# Patient Record
Sex: Male | Born: 1970 | Race: White | Hispanic: No | Marital: Single | Smoking: Never smoker
Health system: Southern US, Community
[De-identification: ages and names within clinical notes are randomized; demographics above are authoritative.]

## PROBLEM LIST (undated history)

## (undated) DIAGNOSIS — E119 Type 2 diabetes mellitus without complications: Secondary | ICD-10-CM

## (undated) DIAGNOSIS — I10 Essential (primary) hypertension: Secondary | ICD-10-CM

## (undated) DIAGNOSIS — Z21 Asymptomatic human immunodeficiency virus [HIV] infection status: Secondary | ICD-10-CM

## (undated) DIAGNOSIS — B20 Human immunodeficiency virus [HIV] disease: Secondary | ICD-10-CM

## (undated) HISTORY — PX: TONSILLECTOMY: SUR1361

---

## 2017-02-16 ENCOUNTER — Encounter (HOSPITAL_COMMUNITY): Payer: Self-pay | Admitting: *Deleted

## 2017-02-16 ENCOUNTER — Emergency Department (HOSPITAL_COMMUNITY)
Admission: EM | Admit: 2017-02-16 | Discharge: 2017-02-17 | Disposition: A | Discharge status: Home / Self Care | Payer: BC Managed Care – PPO | Attending: Emergency Medicine | Admitting: Emergency Medicine

## 2017-02-16 DIAGNOSIS — Z7984 Long term (current) use of oral hypoglycemic drugs: Secondary | ICD-10-CM | POA: Insufficient documentation

## 2017-02-16 DIAGNOSIS — I10 Essential (primary) hypertension: Secondary | ICD-10-CM | POA: Insufficient documentation

## 2017-02-16 DIAGNOSIS — F411 Generalized anxiety disorder: Secondary | ICD-10-CM | POA: Diagnosis not present

## 2017-02-16 DIAGNOSIS — F329 Major depressive disorder, single episode, unspecified: Secondary | ICD-10-CM | POA: Diagnosis present

## 2017-02-16 DIAGNOSIS — Z046 Encounter for general psychiatric examination, requested by authority: Secondary | ICD-10-CM | POA: Diagnosis not present

## 2017-02-16 DIAGNOSIS — F410 Panic disorder [episodic paroxysmal anxiety] without agoraphobia: Secondary | ICD-10-CM | POA: Diagnosis not present

## 2017-02-16 DIAGNOSIS — H6091 Unspecified otitis externa, right ear: Secondary | ICD-10-CM | POA: Insufficient documentation

## 2017-02-16 DIAGNOSIS — E119 Type 2 diabetes mellitus without complications: Secondary | ICD-10-CM | POA: Insufficient documentation

## 2017-02-16 DIAGNOSIS — B20 Human immunodeficiency virus [HIV] disease: Secondary | ICD-10-CM | POA: Diagnosis not present

## 2017-02-16 DIAGNOSIS — R0981 Nasal congestion: Secondary | ICD-10-CM | POA: Insufficient documentation

## 2017-02-16 DIAGNOSIS — Z79899 Other long term (current) drug therapy: Secondary | ICD-10-CM | POA: Diagnosis not present

## 2017-02-16 DIAGNOSIS — R45851 Suicidal ideations: Secondary | ICD-10-CM | POA: Diagnosis not present

## 2017-02-16 DIAGNOSIS — F322 Major depressive disorder, single episode, severe without psychotic features: Secondary | ICD-10-CM | POA: Diagnosis not present

## 2017-02-16 HISTORY — DX: Human immunodeficiency virus (HIV) disease: B20

## 2017-02-16 HISTORY — DX: Asymptomatic human immunodeficiency virus (hiv) infection status: Z21

## 2017-02-16 HISTORY — DX: Type 2 diabetes mellitus without complications: E11.9

## 2017-02-16 HISTORY — DX: Essential (primary) hypertension: I10

## 2017-02-16 LAB — COMPREHENSIVE METABOLIC PANEL
ALBUMIN: 4.2 g/dL (ref 3.5–5.0)
ALT: 49 U/L (ref 17–63)
ANION GAP: 12 (ref 5–15)
AST: 28 U/L (ref 15–41)
Alkaline Phosphatase: 53 U/L (ref 38–126)
BUN: 17 mg/dL (ref 6–20)
CHLORIDE: 100 mmol/L — AB (ref 101–111)
CO2: 26 mmol/L (ref 22–32)
CREATININE: 1.1 mg/dL (ref 0.61–1.24)
Calcium: 9 mg/dL (ref 8.9–10.3)
GFR calc non Af Amer: >60 mL/min (ref >60)
GLUCOSE: 123 mg/dL — AB (ref 65–99)
Potassium: 3.9 mmol/L (ref 3.5–5.1)
SODIUM: 138 mmol/L (ref 135–145)
Total Bilirubin: 0.8 mg/dL (ref 0.3–1.2)
Total Protein: 8.2 g/dL — ABNORMAL HIGH (ref 6.5–8.1)

## 2017-02-16 LAB — CBC
HEMATOCRIT: 45.9 % (ref 39.0–52.0)
HEMOGLOBIN: 15.6 g/dL (ref 13.0–17.0)
MCH: 31 pg (ref 26.0–34.0)
MCHC: 34 g/dL (ref 30.0–36.0)
MCV: 91.1 fL (ref 78.0–100.0)
Platelets: 237 10*3/uL (ref 150–400)
RBC: 5.04 MIL/uL (ref 4.22–5.81)
RDW: 13.3 % (ref 11.5–15.5)
WBC: 7.3 10*3/uL (ref 4.0–10.5)

## 2017-02-16 LAB — RAPID URINE DRUG SCREEN, HOSP PERFORMED
AMPHETAMINES: NOT DETECTED
Barbiturates: NOT DETECTED
Benzodiazepines: NOT DETECTED
COCAINE: NOT DETECTED
OPIATES: NOT DETECTED
TETRAHYDROCANNABINOL: NOT DETECTED

## 2017-02-16 LAB — SALICYLATE LEVEL

## 2017-02-16 LAB — ETHANOL: Alcohol, Ethyl (B): <10 mg/dL (ref <10)

## 2017-02-16 LAB — ACETAMINOPHEN LEVEL: Acetaminophen (Tylenol), Serum: <10 ug/mL — ABNORMAL LOW (ref 10–30)

## 2017-02-16 MED ORDER — LORAZEPAM 1 MG PO TABS
1.0000 mg | ORAL_TABLET | Freq: Once | ORAL | Status: AC
  Administered 2017-02-17: 1 mg via ORAL
  Filled 2017-02-16: qty 1

## 2017-02-16 NOTE — ED Notes (Signed)
Pt in scrubs, belongings in locker 28

## 2017-02-16 NOTE — ED Notes (Signed)
Patient reports SI no plan. Patient denies HI/AVH. Plan of care discussed. Encouragement and support provided and safety maintain. Q 15 min safety check in place and video monitoring.

## 2017-02-16 NOTE — ED Notes (Signed)
ED Provider at bedside. 

## 2017-02-16 NOTE — ED Notes (Signed)
Bed: WBH42 Expected date:  Expected time:  Means of arrival:  Comments: Hold for 28 

## 2017-02-16 NOTE — ED Notes (Signed)
ED Provider at bedside.  Timothy Knox

## 2017-02-16 NOTE — ED Triage Notes (Signed)
Pt got upset wed. And has had high anxiety and panic attacks.  Pt c/o si thoughts, no plan.  Pt went to urgent care for cold symptoms and also talked about SI, so they called [facility] to bring him here.  He has never had thoughts like this before.  Denies HI.  Denies AVH.

## 2017-02-16 NOTE — ED Provider Notes (Signed)
[facility]-EMERGENCY DEPT Provider Note   CSN: [id number] Arrival date & time: 02/16/17  1829     History   Chief Complaint Chief Complaint  Patient presents with  . Medical Clearance    HPI Pervis HockingScott L Echeverri is a 46 y.o. male with a h/o of DM Type II, HIV, and HTN, who presents to the emergency department with a chief complaint of worsening suicidal ideation x6 days.  The patient reports that he was in a relationship with his partner of 25 years, and they just ended their relationship 6 days ago.  He reports associated constant, worsening anxiety, panic attacks, low mood, decreased appetite, and increased sleep. He reports that he has been unable to work since the break up because of his symptoms. No h/o of similar symptoms. He denies HI, and auditory or visual hallucinations. He denies a specific plan, but he states that his symptoms have been worsening, and he feels as if he spiraling out of control and is unable to manage his symptoms on his own.   He also complains of a "tickle in his throat and congestion for the last 2-3 days. He denies dyspnea, chest pain, fever, or chills.   The history is provided by the patient. No language interpreter was used.    Past Medical History:  Diagnosis Date  . Diabetes mellitus without complication (HCC)   . HIV (human immunodeficiency virus infection) (HCC)   . Hypertension     There are no active problems to display for this patient.   History reviewed. No pertinent surgical history.     Home Medications    Prior to Admission medications   Medication Sig Start Date End Date Taking? Authorizing Provider  atorvastatin (LIPITOR) 20 MG tablet Take 1 tablet daily 02/08/17  Yes [provider]  elvitegravir-cobicistat-emtricitabine-tenofovir (GENVOYA) 150-150-200-10 MG TABS tablet Take 1 tablet by mouth daily with breakfast.   Yes [provider]  lisinopril (PRINIVIL,ZESTRIL) 10 MG tablet Take 1  tablet daily 02/05/17  Yes [provider]  metFORMIN (GLUCOPHAGE) 1000 MG tablet Take 1 tablet twice daily 02/05/17  Yes [provider]    Family History History reviewed. No pertinent family history.  Social History Social History  Substance Use Topics  . Smoking status: Never Smoker  . Smokeless tobacco: Never Used  . Alcohol use Yes     Comment: rarely     Allergies   Patient has no known allergies.   Review of Systems Review of Systems  Constitutional: Negative for activity change.  HENT: Positive for congestion and ear pain. Negative for sinus pain and sinus pressure.   Respiratory: Negative for cough and shortness of breath.   Cardiovascular: Negative for chest pain.  Gastrointestinal: Negative for abdominal pain, diarrhea, nausea and vomiting.  Musculoskeletal: Negative for back pain.  Skin: Negative for rash.     Physical Exam Updated Vital Signs BP 119/75 (BP Location: Left Arm)   Pulse 83   Temp 98.2 F (36.8 C) (Oral)   Resp 18   Ht 6' (1.829 m)   Wt 122.5 kg (270 lb)   SpO2 97%   BMI 36.62 kg/m   Physical Exam  Constitutional: He appears well-developed. No distress.  HENT:  Head: Normocephalic.  Right Ear: Tympanic membrane normal.  Left Ear: Tympanic membrane and ear canal normal.  Nose: Nose normal. Right sinus exhibits no maxillary sinus tenderness and no frontal sinus tenderness. Left sinus exhibits no maxillary sinus tenderness and no frontal sinus tenderness.  Mouth/Throat: Uvula is midline. Posterior oropharyngeal erythema present. No oropharyngeal exudate, posterior oropharyngeal edema or tonsillar abscesses.  Fungal infection and noted to the right canal.   Eyes: Conjunctivae are normal.  Neck: Neck supple.  Cardiovascular: Normal rate, regular rhythm and normal heart sounds.  Exam reveals no gallop and no friction rub.   No murmur heard. Pulmonary/Chest: Effort normal. No respiratory distress. He has no wheezes. He  has no rales.  Abdominal: Soft. He exhibits no distension. There is no tenderness. There is no rebound and no guarding.  Neurological: He is alert.  Skin: Skin is warm and dry. He is not diaphoretic.  Psychiatric: His behavior is normal.  Nursing note and vitals reviewed.   ED Treatments / Results  Labs (all labs ordered are listed, but only abnormal results are displayed) Labs Reviewed  COMPREHENSIVE METABOLIC PANEL - Abnormal; Notable for the following:       Result Value   Chloride 100 (*)    Glucose, Bld 123 (*)    Total Protein 8.2 (*)    All other components within normal limits  ACETAMINOPHEN LEVEL - Abnormal; Notable for the following:    Acetaminophen (Tylenol), Serum <10 (*)    All other components within normal limits  ETHANOL  SALICYLATE LEVEL  CBC  RAPID URINE DRUG SCREEN, HOSP PERFORMED    EKG  EKG Interpretation None       Radiology No results found.  Procedures Procedures (including critical care time)  Medications Ordered in ED Medications  metFORMIN (GLUCOPHAGE) tablet 1,000 mg (1,000 mg Oral Not Given 02/17/17 0133)  lisinopril (PRINIVIL,ZESTRIL) tablet 10 mg (not administered)  elvitegravir-cobicistat-emtricitabine-tenofovir (GENVOYA) 150-150-200-10 MG tablet 1 tablet (not administered)  atorvastatin (LIPITOR) tablet 20 mg (not administered)  LORazepam (ATIVAN) tablet 1 mg (1 mg Oral Given 02/17/17 0002)     Initial Impression / Assessment and Plan / ED Course  I have reviewed the triage vital signs and the nursing notes.  Pertinent labs & imaging results that were available during my care of the patient were reviewed by me and considered in my medical decision making (see chart for details).     46 year old male presenting with passive SI, worsening low mood, anxiety, and increasing panic attacks x7 days that began after he went through a break up with his partner of 25 years. No HI or auditory and visual hallucinations. On exam, mild  fungal infection noted to the right ear canal. Spoke with pharmacy and no otic fungal medications are available in the hospital pharmacy at this time. Will d/c the patient home with a prescription. The patient is medically cleared at this time. Consult to TTS pending. Home meds ordered. Patient care transferred to [facility] at the end of my shift. Patient presentation, ED course, and plan of care discussed with review of all pertinent labs and imaging. Please see his/her note for further details regarding further ED course and disposition.  Final Clinical Impressions(s) / ED Diagnoses   Final diagnoses:  None    New Prescriptions New Prescriptions   No medications on file     Barkley Boards, PA-C 02/17/17 0133    Pricilla Loveless, MD 02/17/17 2314

## 2017-02-17 DIAGNOSIS — Z630 Problems in relationship with spouse or partner: Secondary | ICD-10-CM | POA: Diagnosis not present

## 2017-02-17 DIAGNOSIS — F410 Panic disorder [episodic paroxysmal anxiety] without agoraphobia: Secondary | ICD-10-CM

## 2017-02-17 DIAGNOSIS — F322 Major depressive disorder, single episode, severe without psychotic features: Secondary | ICD-10-CM | POA: Diagnosis not present

## 2017-02-17 DIAGNOSIS — F419 Anxiety disorder, unspecified: Secondary | ICD-10-CM | POA: Diagnosis not present

## 2017-02-17 DIAGNOSIS — R451 Restlessness and agitation: Secondary | ICD-10-CM

## 2017-02-17 DIAGNOSIS — R4587 Impulsiveness: Secondary | ICD-10-CM

## 2017-02-17 DIAGNOSIS — F411 Generalized anxiety disorder: Secondary | ICD-10-CM | POA: Diagnosis not present

## 2017-02-17 DIAGNOSIS — R45851 Suicidal ideations: Secondary | ICD-10-CM

## 2017-02-17 MED ORDER — LISINOPRIL 10 MG PO TABS
10.0000 mg | ORAL_TABLET | Freq: Every day | ORAL | Status: DC
  Administered 2017-02-17: 10 mg via ORAL
  Filled 2017-02-17: qty 1

## 2017-02-17 MED ORDER — METFORMIN HCL 500 MG PO TABS
1000.0000 mg | ORAL_TABLET | Freq: Two times a day (BID) | ORAL | Status: DC
  Administered 2017-02-17: 1000 mg via ORAL
  Filled 2017-02-17: qty 2

## 2017-02-17 MED ORDER — ATORVASTATIN CALCIUM 20 MG PO TABS
20.0000 mg | ORAL_TABLET | Freq: Every day | ORAL | Status: DC
  Filled 2017-02-17: qty 1

## 2017-02-17 MED ORDER — ELVITEG-COBIC-EMTRICIT-TENOFAF 150-150-200-10 MG PO TABS
1.0000 | ORAL_TABLET | Freq: Every day | ORAL | Status: DC
  Administered 2017-02-17: 1 via ORAL
  Filled 2017-02-17: qty 1

## 2017-02-17 NOTE — BH Assessment (Addendum)
Assessment Note  Timothy Knox is an 46 y.o. male, who presents voluntary and unaccompanied to [facility]. Clinician asked the pt, "what brought you to the hospital?" Pt reported, six days ago his partner left him after being together for 25 years. Pt reported, his partner told him he left because, "he built up some much resentment and couldn't stay" Pt reported, since the break up he has experienced panic attacks (can breathe, chest tightening, spiraling out), "hyper-anxiety." Pt reported, earlier he had passive and active suicidal thoughts, with a plan of jumping off a building or cutting himself. Pt reported, access to kitchen knives. Pt reported, "I don't want to kill myself but I feel worked up and that comes to my mind." Pt reported, when he becomes anxious is when he has suicidal thoughts. Pt denies, SI, HI, AVH and self-injurious behaviors.   Pt denies abuse and substance use. Pt UDS is negative. Pt reported, he has a pending counseling appointment on 02/18/2017, with Marcello Moores. Pt reported, not knowing his counselors last name. Pt denies, previous inpatient admissions.   Pt presents quiet/wake in scrubs with logical/coherent speech. Pt's eye contact was good. Pt's mood was depressed/anxious. Pt's affect was congruent with mood. Pt's thought process was relevant/coherent. Pt's judgement was unimpaired. Pt's concentration was normal. Pt's insight and impulse control are good. Pt reported, if discharged from [facility] he could contract for safety. Pt reported, if inpatient treatment was recommended he would-sign-in voluntarily.   Diagnosis: F32.2 Major Depressive Disorder, Single episode Severe, without Psychosis.                    F41.1 Generalized Anxiety Disorder.  Past Medical History:  Past Medical History:  Diagnosis Date  . Diabetes mellitus without complication (HCC)   . HIV (human immunodeficiency virus infection) (HCC)   . Hypertension     History reviewed. No pertinent surgical  history.  Family History: History reviewed. No pertinent family history.  Social History:  reports that he has never smoked. He has never used smokeless tobacco. He reports that he drinks alcohol. He reports that he does not use drugs.  Additional Social History:  Alcohol / Drug Use Pain Medications: See MAR Prescriptions: See MAR Over the Counter: See MAR History of alcohol / drug use?: No history of alcohol / drug abuse (Pt denies. Pt's UDS is negative. )  CIWA: CIWA-Ar BP: 119/75 Pulse Rate: 83 COWS:    Allergies: No Known Allergies  Home Medications:  (Not in a hospital admission)  OB/GYN Status:  No LMP for male patient.  General Assessment Data Location of Assessment: [facility] TTS Assessment: In system Is this a Tele or Face-to-Face Assessment?: Face-to-Face Is this an Initial Assessment or a Re-assessment for this encounter?: Initial Assessment Marital status: Single Living Arrangements: Alone Can pt return to current living arrangement?: Yes Admission Status: Voluntary Is patient capable of signing voluntary admission?: Yes Referral Source: MD Insurance type: Self-pay     Crisis Care Plan Living Arrangements: Alone Legal Guardian: Other: (Self) Name of Psychiatrist: NA Name of Therapist: Marcello Moores. Pt unsure of last name.   Education Status Is patient currently in school?: No Current Grade: NA Highest grade of school patient has completed: Pt reported, almost has a Best boy.  Name of school: NA Contact person: NA  Risk to self with the past 6 months Suicidal Ideation: No-Not Currently/Within Last 6 Months Has patient been a risk to self within the past 6 months prior to admission? : Yes Suicidal Intent:  No-Not Currently/Within Last 6 Months Has patient had any suicidal intent within the past 6 months prior to admission? : Yes Is patient at risk for suicide?: Yes Suicidal Plan?: No-Not Currently/Within Last 6 Months Has patient had any suicidal plan within  the past 6 months prior to admission? : Yes Access to Means: Yes Specify Access to Suicidal Means: Pt reported, to cut self, jump off a building. . What has been your use of drugs/alcohol within the last 12 months?: Pt denies. Pt's UDS is negative.  Previous Attempts/Gestures: No How many times?: 0 Other Self Harm Risks: Pt denies.  Triggers for Past Attempts: None known Intentional Self Injurious Behavior: None (Pt denies. ) Family Suicide History: No Recent stressful life event(s): Other (Comment) (recent break up with partner of 25 years. ) Persecutory voices/beliefs?: No Depression: Yes Depression Symptoms: Feeling angry/irritable, Fatigue, Tearfulness, Isolating, Feeling worthless/self pity, Loss of interest in usual pleasures, Guilt Substance abuse history and/or treatment for substance abuse?: No Suicide prevention information given to non-admitted patients: Not applicable  Risk to Others within the past 6 months Homicidal Ideation: No (Pt denies. ) Does patient have any lifetime risk of violence toward others beyond the six months prior to admission? : No Thoughts of Harm to Others: No Current Homicidal Intent: No Current Homicidal Plan: No Access to Homicidal Means: No Identified Victim: NA History of harm to others?: No Assessment of Violence: None Noted Violent Behavior Description: NA Does patient have access to weapons?: No (kitchen knives. ) Criminal Charges Pending?: No Does patient have a court date: No Is patient on probation?: No  Psychosis Hallucinations: None noted (Pt denies.) Delusions: None noted (Pt denies.)  Mental Status Report Appearance/Hygiene: In scrubs Eye Contact: Good Motor Activity: Unremarkable Speech: Logical/coherent Level of Consciousness: Quiet/awake Mood: Depressed, Anxious Affect: Other (Comment) (congruent with mood. ) Anxiety Level: Panic Attacks Panic attack frequency: Pt reported, having panic since his break up six days ago.   Most recent panic attack: Pt reporte, tonight. Thought Processes: Relevant, Coherent Judgement: Unimpaired Orientation: Other (Comment) (day, year, city and state.) Obsessive Compulsive Thoughts/Behaviors: None  Cognitive Functioning Concentration: Normal Memory: Recent Intact IQ: Average Insight: Good Impulse Control: Good Appetite: Poor (Pt reported, not eatting in two days. ) Sleep: Decreased Total Hours of Sleep:  (Pt reported, 6-7 hours. ) Vegetative Symptoms: None  ADLScreening [facility] Assessment Services) Patient's cognitive ability adequate to safely complete daily activities?: Yes Patient able to express need for assistance with ADLs?: Yes Independently performs ADLs?: Yes (appropriate for developmental age)  Prior Inpatient Therapy Prior Inpatient Therapy: No Prior Therapy Dates: NA Prior Therapy Facilty/Provider(s): NA Reason for Treatment: NA  Prior Outpatient Therapy Prior Outpatient Therapy: Yes Prior Therapy Dates: 02/18/2017 Prior Therapy Facilty/Provider(s): Marcello MooresIsaac. Pt reported not knowing last name.  Reason for Treatment: Counselor.  Does patient have an ACCT team?: No Does patient have Intensive In-House Services?  : No Does patient have [facility] services? : No Does patient have P4CC services?: No  ADL Screening (condition at time of admission) Patient's cognitive ability adequate to safely complete daily activities?: Yes Is the patient deaf or have difficulty hearing?: No Does the patient have difficulty seeing, even when wearing glasses/contacts?: Yes Does the patient have difficulty concentrating, remembering, or making decisions?: Yes Patient able to express need for assistance with ADLs?: Yes Does the patient have difficulty dressing or bathing?: No Independently performs ADLs?: Yes (appropriate for developmental age) Communication: Independent Dressing (OT): Independent Grooming: Independent Feeding: Independent Bathing: Independent Toileting:  Independent  In/Out Bed: Independent Does the patient have difficulty walking or climbing stairs?: No Weakness of Legs: None Weakness of Arms/Hands: None       Abuse/Neglect Assessment (Assessment to be complete while patient is alone) Physical Abuse: Denies (Pt denies.) Verbal Abuse: Denies (Pt denies.) Sexual Abuse: Denies (Pt denies. ) Exploitation of patient/patient's resources: Denies (Pt denies. ) Self-Neglect: Denies (Pt denies.)     Merchant navy officer (For Healthcare) Does Patient Have a Medical Advance Directive?: No Type of Advance Directive: Healthcare Power of Attorney, Living will Copy of Healthcare Power of Attorney in Chart?: No - copy requested Copy of Living Will in Chart?: No - copy requested    Additional Information 1:1 In Past 12 Months?: No CIRT Risk: No Elopement Risk: No Does patient have medical clearance?: Yes     Disposition: Donell Sievert, PA recommends inpatient treatment. Disposition discussed with Tresa Endo, PA and Leslie Andrea, RN. TTS to seek placement.    Disposition Initial Assessment Completed for this Encounter: Yes Disposition of Patient: Inpatient treatment program Type of inpatient treatment program: Adult  On Site Evaluation by:   Reviewed with Physician:  Tresa Endo, PA and Donell Sievert, PA.  Redmond Pulling 02/17/2017 2:25 AM   Redmond Pulling, MS, LPC, CRC Triage Specialist [phone]

## 2017-02-17 NOTE — Discharge Instructions (Signed)
For your behavioral health needs, you advised to continue treatment with your current outpatient provider.  You have also indicated an interest in the Mental Health Intensive Outpatient Program and the Partial Hospitalization Program at the [facility].  Please review the printed information about these program provided by hospital staff at your convenience.  If you decide to pursue one of the programs, contact them at your earliest opportunity to ask about enrolling:       [facility]      [address]      [place], [place] [address]      [phone]

## 2017-02-17 NOTE — ED Notes (Signed)
Pt noted he is ready to go home. Denies SI/HI/AVH.

## 2017-02-17 NOTE — ED Provider Notes (Signed)
4:43 AM Patient recommended for inpatient treatment. TTS to seek placement.  Disposition to be determined by oncoming ED provider.  Vitals:   02/16/17 1852 02/16/17 2100  BP: 132/80 119/75  Pulse: 89 83  Resp: 17 18  Temp: 98.1 F (36.7 C) 98.2 F (36.8 C)  TempSrc: Oral Oral  SpO2: 98% 97%  Weight: 122.5 kg (270 lb)   Height: 6' (1.829 m)       Antony MaduraHumes, Endrit Gittins, PA-C 02/17/17 [id number]    Shon BatonHorton, Courtney F, MD 02/17/17 [phone]

## 2017-02-17 NOTE — [facility] Suicide Risk Assessment (Signed)
Suicide Risk Assessment  Discharge Assessment   [facility]   Principal Problem: GAD (generalized anxiety disorder) Discharge Diagnoses:  Patient Active Problem List   Diagnosis Date Noted  . GAD (generalized anxiety disorder) [F41.1] 02/17/2017  . Current severe episode of major depressive disorder without psychotic features without prior episode (HCC) [F32.2]    Pt was seen and chart reviewed with treatment team and Dr Sharma CovertNorman. [facility] a [facility] of suicidal ideation after a relationship break up. Pt stated he and his partner had been together for 25 years and since last week his anxiety and panic attacks have increased causing him to have increased suicidal thoughts. Pt stated he does not have a plan. Today, Pt denies suicidal/homicidal ideation, auditory and visual hallucinations, and does not appear to be responding to internal stimuli. Pt was able to contract for safety upon discharge. Pt has an appointment with his therapist, Duwayne Hecksaiah today at 3:00 PM. Pt denies access to weapons. Pt has been given outpatient resources for IOP and PHP and will look into them on his own. Pt denies drug and alcohol use, BAL and UDS are negative.  Pt is stable and psychiatrically clear for discharge.   Total Time spent with patient: 45 minutes  Musculoskeletal: Strength & Muscle Tone: within normal limits Gait & Station: normal Patient leans: N/A  Psychiatric Specialty Exam:   Blood pressure 134/87, pulse 86, temperature 98.2 F (36.8 C), temperature source Oral, resp. rate 20, height 6' (1.829 m), weight 122.5 kg (270 lb), SpO2 97 %.Body mass index is 36.62 kg/m.  General Appearance: Casual  Eye Contact::  Good  Speech:  Clear and Coherent409  Volume:  Normal  Mood:  Anxious and Depressed  Affect:  Congruent and Depressed  Thought Process:  Coherent, Goal Directed and Linear  Orientation:  Full (Time, Place, and Person)  Thought Content:  Logical   Suicidal Thoughts:  No  Homicidal Thoughts:  No  Memory:  Immediate;   Good Recent;   Good Remote;   Fair  Judgement:  Fair  Insight:  Fair  Psychomotor Activity:  Normal  Concentration:  Good  Recall:  Good  Fund of Knowledge:Good  Language: Good  Akathisia:  No  Handed:  Right  AIMS (if indicated):     Assets:  Communication Skills Desire for Improvement Financial Resources/Insurance Housing Physical Health Resilience Social Support Vocational/Educational  Sleep:     Cognition: WNL  ADL's:  Intact   Mental Status Per Nursing Assessment::   On Admission:   anxious, depressed and tearful  Demographic Factors:  Male, Caucasian and Gay, lesbian, or bisexual orientation  Loss Factors: Loss of significant relationship  Historical Factors: Impulsivity  Risk Reduction Factors:   Sense of responsibility to family, Employed, Positive social support and Positive therapeutic relationship  Continued Clinical Symptoms:  Severe Anxiety and/or Agitation Panic Attacks Depression:   Hopelessness Impulsivity  Cognitive Features That Contribute To Risk:  Closed-mindedness    Suicide Risk:  Minimal: No identifiable suicidal ideation.  Patients presenting with no risk factors but with morbid ruminations; may be classified as minimal risk based on the severity of the depressive symptoms    Plan Of Care/Follow-up recommendations:  Activity:  as tolerated Diet:  Heart Healthy  Laveda AbbeLaurie Britton Story Conti, NP 02/17/2017, 12:37 PM

## 2017-02-17 NOTE — ED Notes (Signed)
Patient denies pain and is resting comfortably.  

## 2017-02-17 NOTE — BH Assessment (Signed)
[facility] Assessment Progress Note  Per Juanetta BeetsJacqueline Norman, DO, this pt does not require psychiatric hospitalization at this time.  [facility] with recommendation to continue treatment with his outpatient provider.  This has been included in pt's discharge instructions.  Pt has also expressed an interest in the Mental Health Intensive Outpatient Program and the Partial Hospitalization Program, both at the [facility] [place].  Pt has been provided with printed information about both programs to follow up at his own initiative.  Pt's nurse, Angelique BlonderDenise, has been notified.  Doylene Canninghomas Lizvet Chunn, MA Triage Specialist [phone]

## 2017-12-10 ENCOUNTER — Other Ambulatory Visit: Payer: Self-pay

## 2017-12-10 ENCOUNTER — Encounter (HOSPITAL_BASED_OUTPATIENT_CLINIC_OR_DEPARTMENT_OTHER): Payer: Self-pay | Admitting: *Deleted

## 2017-12-15 ENCOUNTER — Encounter (HOSPITAL_BASED_OUTPATIENT_CLINIC_OR_DEPARTMENT_OTHER)
Admission: RE | Admit: 2017-12-15 | Discharge: 2017-12-15 | Disposition: A | Discharge status: Home / Self Care | Payer: BC Managed Care – PPO | Source: Ambulatory Visit | Attending: Plastic Surgery | Admitting: Plastic Surgery

## 2017-12-15 DIAGNOSIS — R9431 Abnormal electrocardiogram [ECG] [EKG]: Secondary | ICD-10-CM | POA: Diagnosis not present

## 2017-12-15 DIAGNOSIS — I10 Essential (primary) hypertension: Secondary | ICD-10-CM | POA: Diagnosis not present

## 2017-12-15 DIAGNOSIS — Z01810 Encounter for preprocedural cardiovascular examination: Secondary | ICD-10-CM | POA: Diagnosis present

## 2017-12-15 DIAGNOSIS — E119 Type 2 diabetes mellitus without complications: Secondary | ICD-10-CM | POA: Insufficient documentation

## 2017-12-15 DIAGNOSIS — Z01812 Encounter for preprocedural laboratory examination: Secondary | ICD-10-CM | POA: Insufficient documentation

## 2017-12-15 LAB — BASIC METABOLIC PANEL
ANION GAP: 8 (ref 5–15)
BUN: 13 mg/dL (ref 6–20)
CHLORIDE: 102 mmol/L (ref 98–111)
CO2: 29 mmol/L (ref 22–32)
Calcium: 9.7 mg/dL (ref 8.9–10.3)
Creatinine, Ser: 0.9 mg/dL (ref 0.61–1.24)
GFR calc Af Amer: >60 mL/min (ref >60)
Glucose, Bld: 113 mg/dL — ABNORMAL HIGH (ref 70–99)
POTASSIUM: 4.4 mmol/L (ref 3.5–5.1)
SODIUM: 139 mmol/L (ref 135–145)

## 2017-12-15 NOTE — Progress Notes (Signed)
Dr. Desmond Lopeurk reviewed EKG and OK'd for surgery on 12/17/17.

## 2017-12-17 ENCOUNTER — Ambulatory Visit (HOSPITAL_BASED_OUTPATIENT_CLINIC_OR_DEPARTMENT_OTHER): Payer: BC Managed Care – PPO | Admitting: Anesthesiology

## 2017-12-17 ENCOUNTER — Ambulatory Visit (HOSPITAL_BASED_OUTPATIENT_CLINIC_OR_DEPARTMENT_OTHER)
Admission: RE | Admit: 2017-12-17 | Discharge: 2017-12-17 | Disposition: A | Discharge status: Home / Self Care | Payer: BC Managed Care – PPO | Source: Ambulatory Visit | Attending: Plastic Surgery | Admitting: Plastic Surgery

## 2017-12-17 ENCOUNTER — Other Ambulatory Visit: Payer: Self-pay

## 2017-12-17 ENCOUNTER — Encounter (HOSPITAL_BASED_OUTPATIENT_CLINIC_OR_DEPARTMENT_OTHER): Payer: Self-pay | Admitting: Anesthesiology

## 2017-12-17 ENCOUNTER — Encounter (HOSPITAL_BASED_OUTPATIENT_CLINIC_OR_DEPARTMENT_OTHER)
Admission: RE | Disposition: A | Discharge status: Home / Self Care | Payer: Self-pay | Source: Ambulatory Visit | Attending: Plastic Surgery

## 2017-12-17 DIAGNOSIS — E119 Type 2 diabetes mellitus without complications: Secondary | ICD-10-CM | POA: Diagnosis not present

## 2017-12-17 DIAGNOSIS — Z21 Asymptomatic human immunodeficiency virus [HIV] infection status: Secondary | ICD-10-CM | POA: Diagnosis not present

## 2017-12-17 DIAGNOSIS — F909 Attention-deficit hyperactivity disorder, unspecified type: Secondary | ICD-10-CM | POA: Diagnosis not present

## 2017-12-17 DIAGNOSIS — E669 Obesity, unspecified: Secondary | ICD-10-CM | POA: Diagnosis not present

## 2017-12-17 DIAGNOSIS — Z6837 Body mass index (BMI) 37.0-37.9, adult: Secondary | ICD-10-CM | POA: Diagnosis not present

## 2017-12-17 DIAGNOSIS — R222 Localized swelling, mass and lump, trunk: Secondary | ICD-10-CM | POA: Diagnosis not present

## 2017-12-17 DIAGNOSIS — Z7984 Long term (current) use of oral hypoglycemic drugs: Secondary | ICD-10-CM | POA: Diagnosis not present

## 2017-12-17 DIAGNOSIS — I10 Essential (primary) hypertension: Secondary | ICD-10-CM | POA: Insufficient documentation

## 2017-12-17 HISTORY — PX: MASS EXCISION: SHX2000

## 2017-12-17 LAB — GLUCOSE, CAPILLARY
Glucose-Capillary: 134 mg/dL — ABNORMAL HIGH (ref 70–99)
Glucose-Capillary: 130 mg/dL — ABNORMAL HIGH (ref 70–99)

## 2017-12-17 SURGERY — EXCISION MASS
Anesthesia: General | Site: Flank | Laterality: Right

## 2017-12-17 MED ORDER — SUCCINYLCHOLINE CHLORIDE 20 MG/ML IJ SOLN
INTRAMUSCULAR | Status: DC | PRN
  Administered 2017-12-17: 60 mg via INTRAVENOUS

## 2017-12-17 MED ORDER — DEXTROSE 5 % IV SOLN
3.0000 g | Freq: Once | INTRAVENOUS | Status: AC
  Administered 2017-12-17: 3 g via INTRAVENOUS

## 2017-12-17 MED ORDER — BUPIVACAINE-EPINEPHRINE 0.25% -1:200000 IJ SOLN
INTRAMUSCULAR | Status: DC | PRN
  Administered 2017-12-17: 20 mL

## 2017-12-17 MED ORDER — PROPOFOL 10 MG/ML IV BOLUS
INTRAVENOUS | Status: DC | PRN
  Administered 2017-12-17: 200 mg via INTRAVENOUS

## 2017-12-17 MED ORDER — DEXAMETHASONE SODIUM PHOSPHATE 10 MG/ML IJ SOLN
INTRAMUSCULAR | Status: AC
  Filled 2017-12-17: qty 1

## 2017-12-17 MED ORDER — CEFAZOLIN SODIUM-DEXTROSE 2-4 GM/100ML-% IV SOLN: 2.0000 g | INTRAVENOUS | Status: DC

## 2017-12-17 MED ORDER — DEXAMETHASONE SODIUM PHOSPHATE 4 MG/ML IJ SOLN
INTRAMUSCULAR | Status: DC | PRN
  Administered 2017-12-17: 10 mg via INTRAVENOUS

## 2017-12-17 MED ORDER — FENTANYL CITRATE (PF) 100 MCG/2ML IJ SOLN: 25.0000 ug | INTRAMUSCULAR | Status: DC | PRN

## 2017-12-17 MED ORDER — FENTANYL CITRATE (PF) 100 MCG/2ML IJ SOLN
INTRAMUSCULAR | Status: DC | PRN
  Administered 2017-12-17: 100 ug via INTRAVENOUS

## 2017-12-17 MED ORDER — CEFAZOLIN SODIUM-DEXTROSE 2-4 GM/100ML-% IV SOLN
INTRAVENOUS | Status: AC
  Filled 2017-12-17: qty 100

## 2017-12-17 MED ORDER — PROMETHAZINE HCL 25 MG/ML IJ SOLN: 6.2500 mg | INTRAMUSCULAR | Status: DC | PRN

## 2017-12-17 MED ORDER — CEFAZOLIN SODIUM-DEXTROSE 1-4 GM/50ML-% IV SOLN
INTRAVENOUS | Status: AC
  Filled 2017-12-17: qty 50

## 2017-12-17 MED ORDER — SCOPOLAMINE 1 MG/3DAYS TD PT72: 1.0000 | MEDICATED_PATCH | Freq: Once | TRANSDERMAL | Status: DC | PRN

## 2017-12-17 MED ORDER — MIDAZOLAM HCL 2 MG/2ML IJ SOLN
INTRAMUSCULAR | Status: AC
  Filled 2017-12-17: qty 2

## 2017-12-17 MED ORDER — BUPIVACAINE-EPINEPHRINE (PF) 0.25% -1:200000 IJ SOLN
INTRAMUSCULAR | Status: AC
  Filled 2017-12-17: qty 30

## 2017-12-17 MED ORDER — HYDROCODONE-ACETAMINOPHEN 5-325 MG PO TABS: 1.0000 | ORAL_TABLET | ORAL | 0 refills | Status: DC | PRN

## 2017-12-17 MED ORDER — EPHEDRINE SULFATE 50 MG/ML IJ SOLN
INTRAMUSCULAR | Status: DC | PRN
  Administered 2017-12-17: 25 mg via INTRAVENOUS

## 2017-12-17 MED ORDER — PHENYLEPHRINE HCL 10 MG/ML IJ SOLN
INTRAMUSCULAR | Status: DC | PRN
  Administered 2017-12-17 (×3): 80 ug via INTRAVENOUS

## 2017-12-17 MED ORDER — MIDAZOLAM HCL 5 MG/5ML IJ SOLN
INTRAMUSCULAR | Status: DC | PRN
  Administered 2017-12-17: 2 mg via INTRAVENOUS

## 2017-12-17 MED ORDER — LIDOCAINE HCL (CARDIAC) PF 100 MG/5ML IV SOSY
PREFILLED_SYRINGE | INTRAVENOUS | Status: DC | PRN
  Administered 2017-12-17: 50 mg via INTRAVENOUS

## 2017-12-17 MED ORDER — ONDANSETRON HCL 4 MG/2ML IJ SOLN
INTRAMUSCULAR | Status: AC
  Filled 2017-12-17: qty 2

## 2017-12-17 MED ORDER — ONDANSETRON HCL 4 MG/2ML IJ SOLN
INTRAMUSCULAR | Status: DC | PRN
  Administered 2017-12-17: 4 mg via INTRAVENOUS

## 2017-12-17 MED ORDER — FENTANYL CITRATE (PF) 100 MCG/2ML IJ SOLN
INTRAMUSCULAR | Status: AC
  Filled 2017-12-17: qty 2

## 2017-12-17 MED ORDER — LIDOCAINE 2% (20 MG/ML) 5 ML SYRINGE
INTRAMUSCULAR | Status: AC
  Filled 2017-12-17: qty 5

## 2017-12-17 MED ORDER — FENTANYL CITRATE (PF) 100 MCG/2ML IJ SOLN: 50.0000 ug | INTRAMUSCULAR | Status: DC | PRN

## 2017-12-17 MED ORDER — EPHEDRINE 5 MG/ML INJ
INTRAVENOUS | Status: AC
  Filled 2017-12-17: qty 20

## 2017-12-17 MED ORDER — PROPOFOL 10 MG/ML IV BOLUS
INTRAVENOUS | Status: AC
  Filled 2017-12-17: qty 20

## 2017-12-17 MED ORDER — MIDAZOLAM HCL 2 MG/2ML IJ SOLN: 1.0000 mg | INTRAMUSCULAR | Status: DC | PRN

## 2017-12-17 MED ORDER — LACTATED RINGERS IV SOLN
INTRAVENOUS | Status: DC
  Administered 2017-12-17 (×2): via INTRAVENOUS

## 2017-12-17 SURGICAL SUPPLY — 71 items
ADH SKN CLS APL DERMABOND .7 (GAUZE/BANDAGES/DRESSINGS) ×1
APL SKNCLS STERI-STRIP NONHPOA (GAUZE/BANDAGES/DRESSINGS)
BENZOIN TINCTURE PRP APPL 2/3 (GAUZE/BANDAGES/DRESSINGS) IMPLANT
BLADE CLIPPER SURG (BLADE) IMPLANT
BLADE SURG 11 STRL SS (BLADE) IMPLANT
BLADE SURG 15 STRL LF DISP TIS (BLADE) ×1 IMPLANT
BLADE SURG 15 STRL SS (BLADE) ×3
CANISTER SUCT 1200ML W/VALVE (MISCELLANEOUS) ×2 IMPLANT
CHLORAPREP W/TINT 26ML (MISCELLANEOUS) ×3 IMPLANT
CLOSURE WOUND 1/2 X4 (GAUZE/BANDAGES/DRESSINGS)
COVER BACK TABLE 60X90IN (DRAPES) ×3 IMPLANT
COVER MAYO STAND STRL (DRAPES) ×3 IMPLANT
DERMABOND ADVANCED (GAUZE/BANDAGES/DRESSINGS) ×2
DERMABOND ADVANCED .7 DNX12 (GAUZE/BANDAGES/DRESSINGS) IMPLANT
DRAIN JP 10F RND SILICONE (MISCELLANEOUS) IMPLANT
DRAPE LAPAROTOMY 100X72 PEDS (DRAPES) ×2 IMPLANT
DRAPE U-SHAPE 76X120 STRL (DRAPES) IMPLANT
DRSG PAD ABDOMINAL 8X10 ST (GAUZE/BANDAGES/DRESSINGS) ×2 IMPLANT
DRSG TELFA 3X8 NADH (GAUZE/BANDAGES/DRESSINGS) IMPLANT
ELECT COATED BLADE 2.86 ST (ELECTRODE) ×2 IMPLANT
ELECT NDL BLADE 2-5/6 (NEEDLE) ×1 IMPLANT
ELECT NEEDLE BLADE 2-5/6 (NEEDLE) ×3 IMPLANT
ELECT REM PT RETURN 9FT ADLT (ELECTROSURGICAL) ×3
ELECT REM PT RETURN 9FT PED (ELECTROSURGICAL)
ELECTRODE REM PT RETRN 9FT PED (ELECTROSURGICAL) IMPLANT
ELECTRODE REM PT RTRN 9FT ADLT (ELECTROSURGICAL) IMPLANT
EVACUATOR SILICONE 100CC (DRAIN) IMPLANT
GAUZE SPONGE 4X4 12PLY STRL LF (GAUZE/BANDAGES/DRESSINGS) IMPLANT
GAUZE XEROFORM 1X8 LF (GAUZE/BANDAGES/DRESSINGS) IMPLANT
GLOVE BIO SURGEON STRL SZ 6 (GLOVE) ×3 IMPLANT
GLOVE BIO SURGEON STRL SZ 6.5 (GLOVE) ×2 IMPLANT
GLOVE BIO SURGEONS STRL SZ 6.5 (GLOVE) ×2
GLOVE BIOGEL PI IND STRL 7.0 (GLOVE) IMPLANT
GLOVE BIOGEL PI INDICATOR 7.0 (GLOVE) ×4
GOWN STRL REUS W/ TWL LRG LVL3 (GOWN DISPOSABLE) ×2 IMPLANT
GOWN STRL REUS W/TWL LRG LVL3 (GOWN DISPOSABLE) ×6
NDL HYPO 30GX1 BEV (NEEDLE) IMPLANT
NDL PRECISIONGLIDE 27X1.5 (NEEDLE) ×1 IMPLANT
NEEDLE HYPO 30GX1 BEV (NEEDLE) IMPLANT
NEEDLE PRECISIONGLIDE 27X1.5 (NEEDLE) ×3 IMPLANT
NS IRRIG 1000ML POUR BTL (IV SOLUTION) ×2 IMPLANT
PACK BASIN DAY SURGERY FS (CUSTOM PROCEDURE TRAY) ×3 IMPLANT
PAD DRESSING TELFA 3X8 NADH (GAUZE/BANDAGES/DRESSINGS) IMPLANT
PENCIL BUTTON HOLSTER BLD 10FT (ELECTRODE) ×3 IMPLANT
RUBBERBAND STERILE (MISCELLANEOUS) IMPLANT
SHEET MEDIUM DRAPE 40X70 STRL (DRAPES) IMPLANT
SLEEVE SCD COMPRESS KNEE MED (MISCELLANEOUS) IMPLANT
SPONGE GAUZE 2X2 8PLY STER LF (GAUZE/BANDAGES/DRESSINGS)
SPONGE GAUZE 2X2 8PLY STRL LF (GAUZE/BANDAGES/DRESSINGS) IMPLANT
SPONGE LAP 18X18 RF (DISPOSABLE) ×2 IMPLANT
STAPLER VISISTAT 35W (STAPLE) ×1 IMPLANT
STRIP CLOSURE SKIN 1/2X4 (GAUZE/BANDAGES/DRESSINGS) IMPLANT
SUCTION FRAZIER HANDLE 10FR (MISCELLANEOUS)
SUCTION TUBE FRAZIER 10FR DISP (MISCELLANEOUS) IMPLANT
SUT ETHILON 4 0 PS 2 18 (SUTURE) IMPLANT
SUT MNCRL AB 4-0 PS2 18 (SUTURE) ×2 IMPLANT
SUT MON AB 5-0 P3 18 (SUTURE) IMPLANT
SUT PLAIN 5 0 P 3 18 (SUTURE) IMPLANT
SUT PROLENE 5 0 P 3 (SUTURE) IMPLANT
SUT PROLENE 6 0 P 1 18 (SUTURE) IMPLANT
SUT VIC AB 3-0 PS2 18 (SUTURE) ×2 IMPLANT
SUT VICRYL 4-0 PS2 18IN ABS (SUTURE) IMPLANT
SWAB COLLECTION DEVICE MRSA (MISCELLANEOUS) IMPLANT
SWAB CULTURE ESWAB REG 1ML (MISCELLANEOUS) IMPLANT
SYR BULB 3OZ (MISCELLANEOUS) ×2 IMPLANT
SYR CONTROL 10ML LL (SYRINGE) ×3 IMPLANT
TOWEL GREEN STERILE FF (TOWEL DISPOSABLE) ×3 IMPLANT
TRAY DSU PREP LF (CUSTOM PROCEDURE TRAY) IMPLANT
TUBE CONNECTING 20'X1/4 (TUBING) ×1
TUBE CONNECTING 20X1/4 (TUBING) ×1 IMPLANT
YANKAUER SUCT BULB TIP 10FT TU (MISCELLANEOUS) ×2 IMPLANT

## 2017-12-17 NOTE — Anesthesia Preprocedure Evaluation (Signed)
Anesthesia Evaluation  Patient identified by MRN, date of birth, ID band Patient awake    Reviewed: Allergy & Precautions, NPO status , Patient's Chart, lab work & pertinent test results  Airway Mallampati: II  TM Distance: >3 FB Neck ROM: Full    Dental  (+) Teeth Intact, Dental Advisory Given   Pulmonary neg pulmonary ROS,    Pulmonary exam normal breath sounds clear to auscultation       Cardiovascular hypertension, Pt. on medications Normal cardiovascular exam Rhythm:Regular Rate:Normal     Neuro/Psych PSYCHIATRIC DISORDERS Anxiety Depression negative neurological ROS     GI/Hepatic negative GI ROS, Neg liver ROS,   Endo/Other  diabetes, Type 2, Oral Hypoglycemic AgentsObesity   Renal/GU negative Renal ROS     Musculoskeletal soft tissue mass right flank   Abdominal   Peds  (+) ADHD Hematology  (+) HIV,   Anesthesia Other Findings Day of surgery medications reviewed with the patient.  Reproductive/Obstetrics                             Anesthesia Physical Anesthesia Plan  ASA: III  Anesthesia Plan: General   Post-op Pain Management:    Induction: Intravenous  PONV Risk Score and Plan: 2 and Dexamethasone, Ondansetron and Midazolam  Airway Management Planned: Oral ETT  Additional Equipment:   Intra-op Plan:   Post-operative Plan: Extubation in OR  Informed Consent: I have reviewed the patients History and Physical, chart, labs and discussed the procedure including the risks, benefits and alternatives for the proposed anesthesia with the patient or authorized representative who has indicated his/her understanding and acceptance.   Dental advisory given  Plan Discussed with: CRNA  Anesthesia Plan Comments:         Anesthesia Quick Evaluation

## 2017-12-17 NOTE — Anesthesia Postprocedure Evaluation (Signed)
Anesthesia Post Note  Patient: Timothy Knox  Procedure(s) Performed: excision subfascial mass right flank (Right Flank)     Patient location during evaluation: PACU Anesthesia Type: General Level of consciousness: awake and alert, awake and oriented Pain management: pain level controlled Vital Signs Assessment: post-procedure vital signs reviewed and stable Respiratory status: spontaneous breathing, nonlabored ventilation and respiratory function stable Cardiovascular status: blood pressure returned to baseline and stable Postop Assessment: no apparent nausea or vomiting Anesthetic complications: no    Last Vitals:  Vitals:   12/17/17 1215 12/17/17 1235  BP: 120/72 119/81  Pulse: 73 81  Resp: 15 18  Temp:  36.9 C  SpO2: 98% 96%    Last Pain:  Vitals:   12/17/17 1235  TempSrc:   PainSc: 1                  Cecile HearingStephen Edward Turk

## 2017-12-17 NOTE — Op Note (Signed)
Operative Note   DATE OF OPERATION: 8.30.19  LOCATION: [facility]-outpatient  SURGICAL DIVISION: Plastic Surgery  PREOPERATIVE DIAGNOSES:  1. Right chest soft tissue mass  POSTOPERATIVE DIAGNOSES:  same  PROCEDURE:  Excision subfascial mass right chest 5 cm  SURGEON: Glenna FellowsBrinda Culley Hedeen MD MBA  ASSISTANT: none  ANESTHESIA:  General.   EBL: 10 ml  COMPLICATIONS: None immediate.   INDICATIONS FOR PROCEDURE:  The patient, Timothy Knox, is a 47 y.o. male born on 11-21-1970, is here for excision soft tissue mass with continued growth and now pain.   FINDINGS: Clinically lipoma.  DESCRIPTION OF PROCEDURE:  The patient's operative site was marked with the patient in the preoperative area. The patient was taken to the operating room. SCDs were placed and IV antibiotics were given. Patient placed in left lateral position. The patient's operative site was prepped and draped in a sterile fashion. A time out was performed and all information was confirmed to be correct. Local anesthetic infiltrated surrounding mass. Incision made in natural skin tension line and carried through subcutaneous tissue and superficial fascia. Skin flaps elevated off underlying palpable mass. Mass dissected free from underlying muscular fascia. Diameter mass 5 cm. Wound irrigated and hemostasis obtained. Closure completed with 3-0 vicryl in superficial fascia and dermis. 4-0 monocryl subcuticular skin closure completed and Dermabond applied. Dry dressing applied. Patient returned to supine position.  The patient was allowed to wake from anesthesia, extubated and taken to the recovery room in satisfactory condition.   SPECIMENS: right chest mass  DRAINS: none  Glenna FellowsBrinda Rhett Mutschler, MD [facility] Re[facility] Surgery [phone], pin [phone]

## 2017-12-17 NOTE — Anesthesia Procedure Notes (Addendum)
Procedure Name: Intubation Date/Time: 12/17/2017 10:56 AM Performed by: Catalina Gravel, MD Pre-anesthesia Checklist: Patient identified, Emergency Drugs available, Suction available, Patient being monitored and Timeout performed Patient Re-evaluated:Patient Re-evaluated prior to induction Oxygen Delivery Method: Circle system utilized Preoxygenation: Pre-oxygenation with 100% oxygen Induction Type: IV induction Ventilation: Two handed mask ventilation required Laryngoscope Size: Glidescope and 4 Grade View: Grade I Tube type: Oral Tube size: 8.0 mm Number of attempts: 3 (Attempt x1 by CRNA with Sabra Heck 2, no view. Attempt x1 by MDA with MAC 4 with Grade 4 view. MDA attempt x1 with Glidescope 4, glottis visualized.) Airway Equipment and Method: Stylet and Video-laryngoscopy Placement Confirmation: ETT inserted through vocal cords under direct vision,  positive ETCO2 and breath sounds checked- equal and bilateral Secured at: 25 cm Tube secured with: Tape Dental Injury: Teeth and Oropharynx as per pre-operative assessment  Difficulty Due To: Difficulty was unanticipated Future Recommendations: Recommend- induction with short-acting agent, and alternative techniques readily available Comments: Redundant pharyngeal tissue, large tonsils, poor view with Miller 2 and MAC 4 blades.  Recommend Glidescope intubation.

## 2017-12-17 NOTE — Transfer of Care (Signed)
Immediate Anesthesia Transfer of Care Note  Patient: Pervis HockingScott L Golden  Procedure(s) Performed: excision subfascial mass right flank (Right Flank)  Patient Location: PACU  Anesthesia Type:General  Level of Consciousness: awake and patient cooperative  Airway & Oxygen Therapy: Patient Spontanous Breathing and Patient connected to face mask oxygen  Post-op Assessment: Report given to RN and Post -op Vital signs reviewed and stable  Post vital signs: Reviewed and stable  Last Vitals:  Vitals Value Taken Time  BP    Temp    Pulse 101 12/17/2017 11:41 AM  Resp 19 12/17/2017 11:41 AM  SpO2 98 % 12/17/2017 11:41 AM  Vitals shown include unvalidated device data.  Last Pain:  Vitals:   12/17/17 1012  TempSrc: Oral  PainSc: 0-No pain      Patients Stated Pain Goal: 0 (12/17/17 1012)  Complications: No apparent anesthesia complications

## 2017-12-17 NOTE — H&P (Signed)
  Subjective:     Patient ID: Timothy Knox is a 47 y.o. male.  HPI  Referred by Dr. Doreen BeamWhitworth for evaluation mass trunk present for last year with no significant change in size but new onset pain over area with movement. No prior biopsy, no history trauma.  PMH significant for HIV, DM, anxiety, depression. HIV followed by [facility], undetectable on last check. DM with HbA1c 6.6 10/2017.   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  Review of Systems  +mass Remainder 12 point review negative    Objective:   Physical Exam  Constitutional: He is oriented to person, place, and time.  Cardiovascular: Normal rate, regular rhythm and normal heart sounds.   Pulmonary/Chest: Effort normal and breath sounds normal.  Neurological: He is alert and oriented to person, place, and time.  Skin:  Fitzpatrick 2  MS: right lateral chest/lower rib cage with palpable mass non mobile no punctum 3 x 6 cm     Assessment:     Right chest mass Uncertain neoplasm trunk    Plan:     Pictures taken. Differential includes lipoma. Given size and suspected depth mass plan excision in OR. Dicsused OP surgery, scar maturation over months, post procedure limitations, need for additional procedures pending pathology, risk recurrence, damage to adjacent structures, wound healing problems.   Glenna FellowsBrinda Kennie Karapetian, MD [facility] Plastic & Reconstructive Surgery [phone], pin [phone]

## 2017-12-17 NOTE — Discharge Instructions (Signed)

## 2017-12-21 ENCOUNTER — Encounter (HOSPITAL_BASED_OUTPATIENT_CLINIC_OR_DEPARTMENT_OTHER): Payer: Self-pay | Admitting: Plastic Surgery

## 2019-06-12 ENCOUNTER — Ambulatory Visit: Payer: BC Managed Care – PPO | Attending: Family

## 2019-06-12 DIAGNOSIS — Z23 Encounter for immunization: Secondary | ICD-10-CM | POA: Insufficient documentation

## 2019-06-12 NOTE — Progress Notes (Signed)
   Covid-19 Vaccination Clinic  Name:  BILLIE INTRIAGO    MRN: [id number] DOB: 12-28-1970  06/12/2019  Mr. Crisman was observed post Covid-19 immunization for 15 minutes without incidence. He was provided with Vaccine Information Sheet and instruction to access the V-Safe system.   Mr. Colgate was instructed to call 911 with any severe reactions post vaccine: Marland Kitchen Difficulty breathing  . Swelling of your face and throat  . A fast heartbeat  . A bad rash all over your body  . Dizziness and weakness    Immunizations Administered    Name Date Dose VIS Date Route   Moderna COVID-19 Vaccine 06/12/2019  2:26 PM 0.5 mL 03/21/2019 Intramuscular   Manufacturer: Moderna   Lot: [id number]   NDC: 34621-947-12

## 2019-07-18 ENCOUNTER — Ambulatory Visit: Payer: BC Managed Care – PPO | Attending: Family

## 2019-07-18 DIAGNOSIS — Z23 Encounter for immunization: Secondary | ICD-10-CM

## 2019-07-18 NOTE — Progress Notes (Signed)
   Covid-19 Vaccination Clinic  Name:  Timothy Knox    MRN: [id number] DOB: 01-Feb-1971  07/18/2019  [name] was observed post Covid-19 immunization for 15 minutes without incident. He was provided with Vaccine Information Sheet and instruction to access the V-Safe system.   [name] was instructed to call 911 with any severe reactions post vaccine: Marland Kitchen Difficulty breathing  . Swelling of face and throat  . A fast heartbeat  . A bad rash all over body  . Dizziness and weakness   Immunizations Administered    Name Date Dose VIS Date Route   Moderna COVID-19 Vaccine 07/18/2019  2:23 PM 0.5 mL 03/21/2019 Intramuscular   Manufacturer: Moderna   Lot: [id number]   NDC: 52778-242-35

## 2019-12-23 ENCOUNTER — Emergency Department (HOSPITAL_COMMUNITY)
Admission: EM | Admit: 2019-12-23 | Discharge: 2019-12-23 | Disposition: A | Discharge status: Home / Self Care | Payer: BC Managed Care – PPO | Attending: Emergency Medicine | Admitting: Emergency Medicine

## 2019-12-23 ENCOUNTER — Emergency Department (HOSPITAL_COMMUNITY): Payer: BC Managed Care – PPO

## 2019-12-23 ENCOUNTER — Other Ambulatory Visit: Payer: Self-pay

## 2019-12-23 ENCOUNTER — Encounter (HOSPITAL_COMMUNITY): Payer: Self-pay

## 2019-12-23 DIAGNOSIS — U071 COVID-19: Secondary | ICD-10-CM | POA: Diagnosis not present

## 2019-12-23 DIAGNOSIS — Z7984 Long term (current) use of oral hypoglycemic drugs: Secondary | ICD-10-CM | POA: Insufficient documentation

## 2019-12-23 DIAGNOSIS — E119 Type 2 diabetes mellitus without complications: Secondary | ICD-10-CM | POA: Insufficient documentation

## 2019-12-23 DIAGNOSIS — B20 Human immunodeficiency virus [HIV] disease: Secondary | ICD-10-CM | POA: Diagnosis not present

## 2019-12-23 DIAGNOSIS — I10 Essential (primary) hypertension: Secondary | ICD-10-CM | POA: Insufficient documentation

## 2019-12-23 DIAGNOSIS — R079 Chest pain, unspecified: Secondary | ICD-10-CM | POA: Diagnosis present

## 2019-12-23 DIAGNOSIS — Z79899 Other long term (current) drug therapy: Secondary | ICD-10-CM | POA: Diagnosis not present

## 2019-12-23 DIAGNOSIS — R55 Syncope and collapse: Secondary | ICD-10-CM

## 2019-12-23 LAB — COMPREHENSIVE METABOLIC PANEL
ALT: 62 U/L — ABNORMAL HIGH (ref 0–44)
AST: 47 U/L — ABNORMAL HIGH (ref 15–41)
Albumin: 4.5 g/dL (ref 3.5–5.0)
Alkaline Phosphatase: 48 U/L (ref 38–126)
Anion gap: 14 (ref 5–15)
BUN: 20 mg/dL (ref 6–20)
CO2: 18 mmol/L — ABNORMAL LOW (ref 22–32)
Calcium: 10.1 mg/dL (ref 8.9–10.3)
Chloride: 106 mmol/L (ref 98–111)
Creatinine, Ser: 1.64 mg/dL — ABNORMAL HIGH (ref 0.61–1.24)
GFR calc Af Amer: 55 mL/min — ABNORMAL LOW (ref >60)
GFR calc non Af Amer: 48 mL/min — ABNORMAL LOW (ref >60)
Glucose, Bld: 251 mg/dL — ABNORMAL HIGH (ref 70–99)
Potassium: 5.1 mmol/L (ref 3.5–5.1)
Sodium: 138 mmol/L (ref 135–145)
Total Bilirubin: 1.2 mg/dL (ref 0.3–1.2)
Total Protein: 8.1 g/dL (ref 6.5–8.1)

## 2019-12-23 LAB — TROPONIN I (HIGH SENSITIVITY)
Troponin I (High Sensitivity): 7 ng/L (ref <18)
Troponin I (High Sensitivity): 6 ng/L (ref <18)

## 2019-12-23 LAB — CBC WITH DIFFERENTIAL/PLATELET
Abs Immature Granulocytes: 0.11 10*3/uL — ABNORMAL HIGH (ref 0.00–0.07)
Basophils Absolute: 0.1 10*3/uL (ref 0.0–0.1)
Basophils Relative: 0 %
Eosinophils Absolute: 0.1 10*3/uL (ref 0.0–0.5)
Eosinophils Relative: 0 %
HCT: 48.7 % (ref 39.0–52.0)
Hemoglobin: 15.6 g/dL (ref 13.0–17.0)
Immature Granulocytes: 1 %
Lymphocytes Relative: 7 %
Lymphs Abs: 1.4 10*3/uL (ref 0.7–4.0)
MCH: 29.3 pg (ref 26.0–34.0)
MCHC: 32 g/dL (ref 30.0–36.0)
MCV: 91.5 fL (ref 80.0–100.0)
Monocytes Absolute: 1.5 10*3/uL — ABNORMAL HIGH (ref 0.1–1.0)
Monocytes Relative: 7 %
Neutro Abs: 18.8 10*3/uL — ABNORMAL HIGH (ref 1.7–7.7)
Neutrophils Relative %: 85 %
Platelets: 387 10*3/uL (ref 150–400)
RBC: 5.32 MIL/uL (ref 4.22–5.81)
RDW: 13.1 % (ref 11.5–15.5)
WBC: 21.9 10*3/uL — ABNORMAL HIGH (ref 4.0–10.5)
nRBC: 0 % (ref 0.0–0.2)

## 2019-12-23 LAB — LIPASE, BLOOD: Lipase: 37 U/L (ref 11–51)

## 2019-12-23 MED ORDER — SODIUM CHLORIDE 0.9 % IV BOLUS
1000.0000 mL | Freq: Once | INTRAVENOUS | Status: AC
  Administered 2019-12-23: 1000 mL via INTRAVENOUS

## 2019-12-23 MED ORDER — ONDANSETRON HCL 4 MG/2ML IJ SOLN
INTRAMUSCULAR | Status: AC
  Filled 2019-12-23: qty 2

## 2019-12-23 MED ORDER — IOHEXOL 350 MG/ML SOLN
75.0000 mL | Freq: Once | INTRAVENOUS | Status: AC | PRN
  Administered 2019-12-23: 75 mL via INTRAVENOUS

## 2019-12-23 MED ORDER — FENTANYL CITRATE (PF) 100 MCG/2ML IJ SOLN
12.5000 ug | Freq: Once | INTRAMUSCULAR | Status: AC
  Administered 2019-12-23: 12.5 ug via INTRAVENOUS
  Filled 2019-12-23: qty 2

## 2019-12-23 MED ORDER — ACETAMINOPHEN 325 MG PO TABS
650.0000 mg | ORAL_TABLET | Freq: Once | ORAL | Status: AC
  Administered 2019-12-23: 650 mg via ORAL
  Filled 2019-12-23: qty 2

## 2019-12-23 NOTE — ED Provider Notes (Signed)
[facility] EMERGENCY DEPARTMENT Provider Note   CSN: [id number] Arrival date & time: 12/23/19  1407     History Chief Complaint  Patient presents with  . Chest Pain  . Hypotension    Timothy Knox is a 49 y.o. male.  Patient is a 49 year old male with a history of well-controlled HIV with undetectable viral load, diabetes, hypertension and hyperlipidemia who presents today with multiple complaints.  Patient did receive his Covid vaccine in March but reports having a mild cough and congestion that started 2 weeks ago.  He was swabbed and tested positive for Covid 2 weeks ago.  He reports he had very minimal symptoms of a mild cough and some congestion but no fever or other general symptoms.  However today he woke up in approximately 2 to 3 hours before arrival he started feeling very unwell.  He had abdominal discomfort, nausea and reports approximately 10 episodes of diarrhea that was nonbloody and 2 episodes of vomiting which was stomach contents.  He had a headache and neck pain but then when he was in the ambulance on the ride over here he started developing midthoracic back pain.  He reports the pain is a 2 or 3 out of 10 does not radiate but makes it difficult for him to become comfortable.  He states before today he had no pain.  He is denied any unilateral leg pain or swelling.  He has not been on any antibiotics and did not receive any specific medications for Covid.  He had just been quarantining at home.  He has been cool and clammy all day but denies any fever or chills.  He has had a nonproductive cough which is gotten slightly worse over the last few days but denies any shortness of breath.  The history is provided by the patient and the EMS personnel.  Chest Pain Pain location:  L lateral chest and R lateral chest (thoracic spine) Pain quality: aching and tightness   Pain radiates to:  Does not radiate Pain severity:  Moderate Onset quality:   Gradual Duration:  1 hour Timing:  Constant Progression:  Unchanged Chronicity:  New Context: at rest   Relieved by:  None tried Worsened by:  Movement Ineffective treatments:  None tried Associated symptoms: abdominal pain, back pain, cough, diaphoresis, headache, nausea and vomiting   Associated symptoms: no fever, no lower extremity edema and no shortness of breath   Associated symptoms comment:  Diarrhea Risk factors comment:  Hx of HIV, DM, HTN and HLD      Past Medical History:  Diagnosis Date  . Diabetes mellitus without complication (HCC)   . HIV (human immunodeficiency virus infection) (HCC)   . Hypertension     Patient Active Problem List   Diagnosis Date Noted  . GAD (generalized anxiety disorder) 02/17/2017  . Current severe episode of major depressive disorder without psychotic features without prior episode [facility])     Past Surgical History:  Procedure Laterality Date  . MASS EXCISION Right 12/17/2017   Procedure: excision subfascial mass right flank;  Surgeon: Glenna Fellows, MD;  Location: [facility];  Service: Plastics;  Laterality: Right;  . TONSILLECTOMY         No family history on file.  Social History   Tobacco Use  . Smoking status: Never Smoker  . Smokeless tobacco: Never Used  Substance Use Topics  . Alcohol use: Yes    Comment: rarely  . Drug use: No  Home Medications Prior to Admission medications   Medication Sig Start Date End Date Taking? Authorizing Provider  atorvastatin (LIPITOR) 20 MG tablet Take 1 tablet daily 02/08/17   [provider]  elvitegravir-cobicistat-emtricitabine-tenofovir (GENVOYA) 150-150-200-10 MG TABS tablet Take 1 tablet by mouth daily with breakfast.    [provider]  HYDROcodone-acetaminophen (NORCO) 5-325 MG tablet Take 1 tablet by mouth every 4 (four) hours as needed for moderate pain. 12/17/17   Glenna Fellows, MD  lisdexamfetamine (VYVANSE) 20 MG capsule Take 20  mg by mouth daily.    [provider]  lisinopril (PRINIVIL,ZESTRIL) 10 MG tablet Take 1 tablet daily 02/05/17   [provider]  metFORMIN (GLUCOPHAGE) 1000 MG tablet Take 1 tablet twice daily 02/05/17   [provider]    Allergies    Patient has no known allergies.  Review of Systems   Review of Systems  Constitutional: Positive for diaphoresis. Negative for fever.  Respiratory: Positive for cough. Negative for shortness of breath.   Cardiovascular: Positive for chest pain.  Gastrointestinal: Positive for abdominal pain, nausea and vomiting.  Musculoskeletal: Positive for back pain.  Neurological: Positive for headaches.  All other systems reviewed and are negative.   Physical Exam Updated Vital Signs BP (!) 89/51 (BP Location: Right Arm)   Pulse 85   Resp 18   Ht 6' (1.829 m)   Wt 120.2 kg   SpO2 97%   BMI 35.94 kg/m   Physical Exam Vitals and nursing note reviewed.  Constitutional:      General: He is not in acute distress.    Appearance: He is well-developed. He is obese. He is ill-appearing and diaphoretic.  HENT:     Head: Normocephalic and atraumatic.     Mouth/Throat:     Mouth: Mucous membranes are moist.  Eyes:     Conjunctiva/sclera: Conjunctivae normal.     Pupils: Pupils are equal, round, and reactive to light.  Neck:     Meningeal: Brudzinski's sign and Kernig's sign absent.  Cardiovascular:     Rate and Rhythm: Normal rate and regular rhythm.     Pulses: Normal pulses.     Heart sounds: No murmur heard.   Pulmonary:     Effort: Pulmonary effort is normal. No respiratory distress.     Breath sounds: Normal breath sounds. No wheezing or rales.  Abdominal:     General: There is no distension.     Palpations: Abdomen is soft.     Tenderness: There is no abdominal tenderness. There is no right CVA tenderness, left CVA tenderness, guarding or rebound.  Musculoskeletal:        General: Normal range of motion.     Cervical  back: Normal range of motion and neck supple. No rigidity or tenderness.     Thoracic back: Tenderness present.       Back:  Skin:    Capillary Refill: Capillary refill takes less than 2 seconds.     Coloration: Skin is pale.     Findings: No erythema or rash.     Comments: Cool and clammy  Neurological:     General: No focal deficit present.     Mental Status: He is alert and oriented to person, place, and time. Mental status is at baseline.     Cranial Nerves: No cranial nerve deficit.     Sensory: No sensory deficit.     Motor: No weakness.  Psychiatric:        Mood and Affect: Mood  normal.        Behavior: Behavior normal.        Thought Content: Thought content normal.     ED Results / Procedures / Treatments   Labs (all labs ordered are listed, but only abnormal results are displayed) Labs Reviewed  CBC WITH DIFFERENTIAL/PLATELET  COMPREHENSIVE METABOLIC PANEL  LIPASE, BLOOD  TROPONIN I (HIGH SENSITIVITY)    EKG None  Radiology No results found.  Procedures Procedures (including critical care time)  Medications Ordered in ED Medications  fentaNYL (SUBLIMAZE) injection 12.5 mcg (has no administration in time range)  sodium chloride 0.9 % bolus 1,000 mL (has no administration in time range)  ondansetron (ZOFRAN) 4 MG/2ML injection (  Given 12/23/19 1419)  sodium chloride 0.9 % bolus 1,000 mL (1,000 mLs Intravenous New Bag/Given 12/23/19 1423)    ED Course  I have reviewed the triage vital signs and the nursing notes.  Pertinent labs & imaging results that were available during my care of the patient were reviewed by me and considered in my medical decision making (see chart for details).    MDM Rules/Calculators/A&P                         49 year old male with multiple underlying medical issues presenting today with multiple symptoms.  Tested Covid +2 weeks ago and had a relatively mild course but then today woke up with significant symptoms of headache, neck  pain, nausea, vomiting, diarrhea and thoracic back pain that started in route here.  He states it is tender in the sides of his chest but he denies new productive cough or fever.  Patient had an episode of emesis here and reports he does feel better.  He still having back discomfort but now reports his headache and neck pain have resolved.  He has no focal abdominal pain on exam concerning for hepatitis, pancreatitis, diverticulitis, perforation or appendicitis.  He denies any urinary symptoms.  Patient does have HIV but his viral loads are undetectable and he takes his medication regularly.  He denies any recent antibiotic use concerning for C. difficile.  Concern for delayed Covid response and inflammation.  Also concern for PE.  Patient does have equal pulses in all 4 extremities and pain does not radiate.  Lower suspicion for dissection at this time.  Patient initially upon arrival here was extremely nauseated and was hypotensive but after an episode of vomiting his blood pressure improved and most likely related to a vagal response.  Patient given IV fluids and antiemetic.  Chest x-ray, labs are pending.  Patient will need a CTA to rule out PE.  He was given a small amount of fentanyl to help with his back pain as he reports it is very uncomfortable but only rated it as a 2-3/10.  Final Clinical Impression(s) / ED Diagnoses Final diagnoses:  None    Rx / DC Orders ED Discharge Orders    None       Gwyneth Sprout, MD 12/23/19 1731

## 2019-12-23 NOTE — ED Notes (Signed)
Got patient into a gown on the monitor patient is resting with call bell in reach 

## 2019-12-23 NOTE — Discharge Instructions (Signed)
Glad that you are feeling better.  Go home continue to self isolate.  Use your pulse ox to monitor your oxygen status.  CT scan of chest here today without evidence of any blood clots or any signs of an early pneumonia.  Work on Programmer, multimedia small amounts of fluid frequently is probably the best approach.  Keep your fever under control with Tylenol.  Return for any new or worse symptoms to include having difficulty breathing or shortness of breath.

## 2019-12-23 NOTE — ED Provider Notes (Signed)
Patient turned over to me from Dr. Anitra Lauth.  Patient diagnosed with a COVID-19 infection about 2 weeks ago.  Had very mild symptoms.  Has been vaccinated in the past.  Patient does have history of HIV.  Been very compliant with his antivirals.  Patient today felt as if he had developed fevers for the first time.  Prior to this he just had a little bit of a cough.  Patient felt he was got a passout had a near syncopal episode on the way in.  Brought in by EMS.  Patient received fluids here felt much better.  Blood pressures have been normal now through a long period of observation.  Did have a significant leukocytosis.  Troponin was not elevated.  CT angio chest which showed no evidence of blood clots or evidence of early pneumonia.  Patient again got a fever felt bad was given Tylenol he is now broken with sweating.  Feels much better.  We did orthostatic blood pressures and they were fine and he also was completely asymptomatic.  I feel patient is probably stable enough for discharge home.  He will return for any new or worse symptoms.  His oxygen saturations here have been fairly consistently at 96% on room air.   Vanetta Mulders, MD 12/23/19 2158

## 2019-12-23 NOTE — ED Notes (Signed)
Patient verbalizes understanding of discharge instructions. Opportunity for questioning and answers were provided. Armband removed by staff, pt discharged from ED ambulatory.   

## 2019-12-23 NOTE — ED Triage Notes (Signed)
Pt arrives via EMS from home with c/o back pain, head pain. Lower abdominal pain and is hypotensive on arrival. Pt COVID positive 2 weeks ago. 4/10 chest pain. Pale and diaphoretic   324 asa 500NS 82/56 95% RA HR 74

## 2021-06-03 IMAGING — CT CT ANGIO CHEST
4 of 12 series · 19 of 38 positions shown · IV contrast (omnipaque)
Comparison: Portable chest obtained today.

CLINICAL DATA: Low back and lower abdominal pain and hypotension.
TUPRV-3U positive test 2 weeks ago.

EXAM:
CT ANGIOGRAPHY CHEST WITH CONTRAST
TECHNIQUE: Multidetector CT imaging of the chest was performed using the
standard protocol during bolus administration of intravenous
contrast. Multiplanar CT image reconstructions and MIPs were
obtained to evaluate the vascular anatomy.
CONTRAST:  75mL OMNIPAQUE IOHEXOL 350 MG/ML SOLN

[Series 5: pe 3mm · axial · 0.75mm/px · z∈[+1312,+1456]mm · 3 of 96 slices shown (1 of 2)]
[im 24/96  lung]
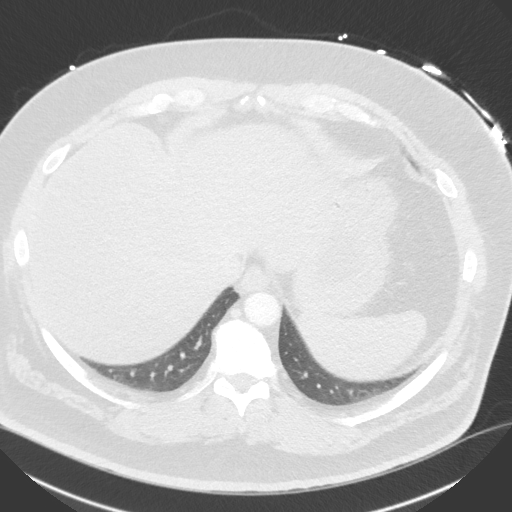
[im 48/96  lung]
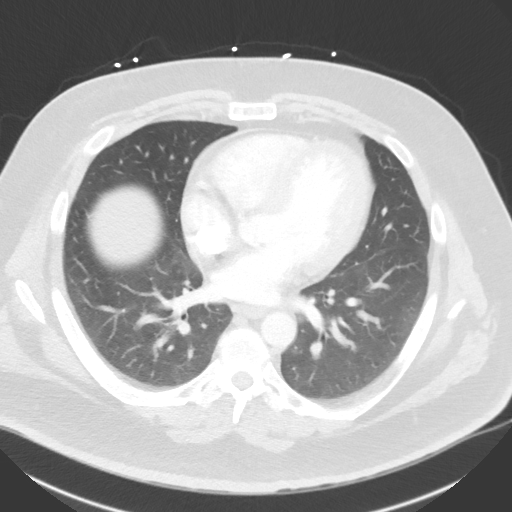
[im 72/96  lung]
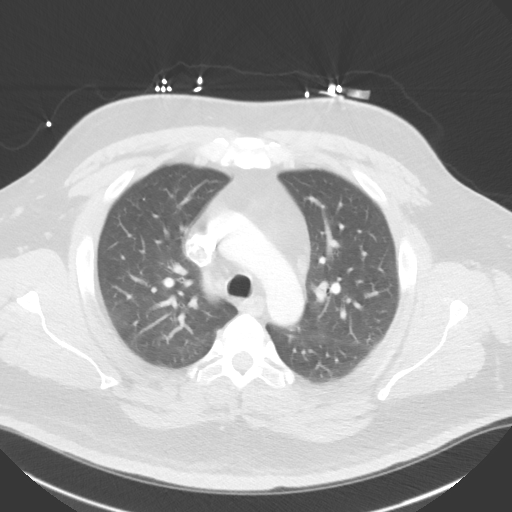

[Series 7: pe thins · axial · 0.75mm/px · z∈[+1276,+1492]mm · 7 of 192 slices shown (1 of 2)]
[im 24/192  lung]
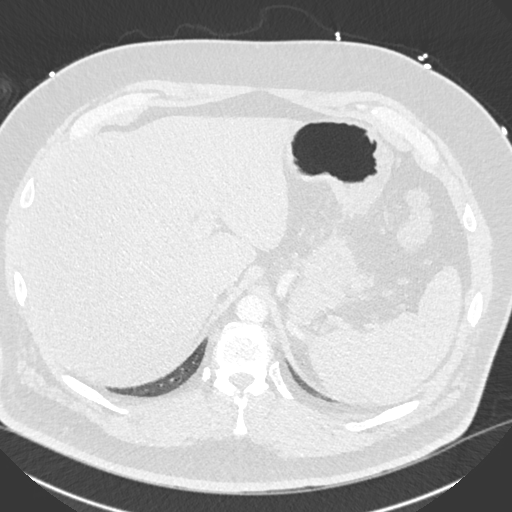
[im 48/192  mediastinal]
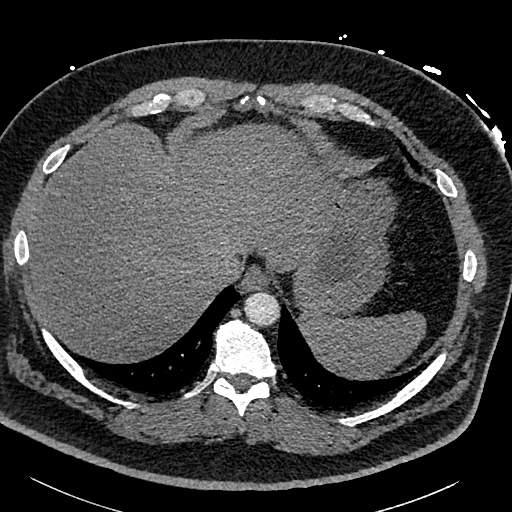
[im 72/192  lung]
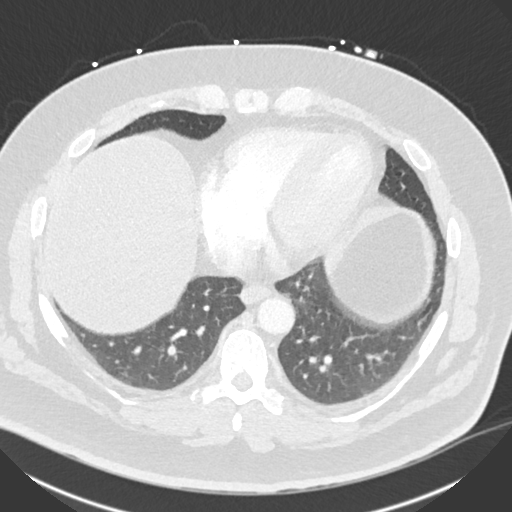
[im 96/192  mediastinal]
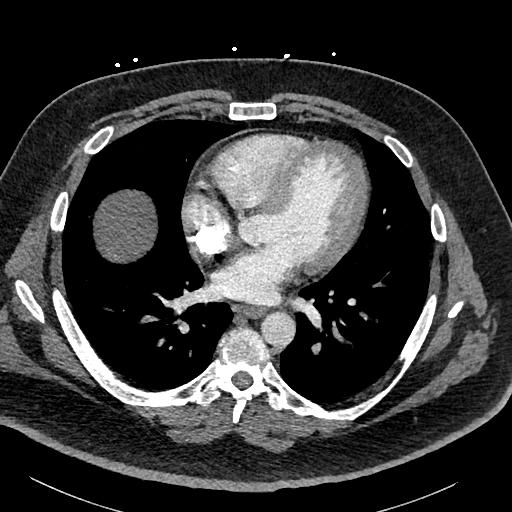
[im 120/192  lung]
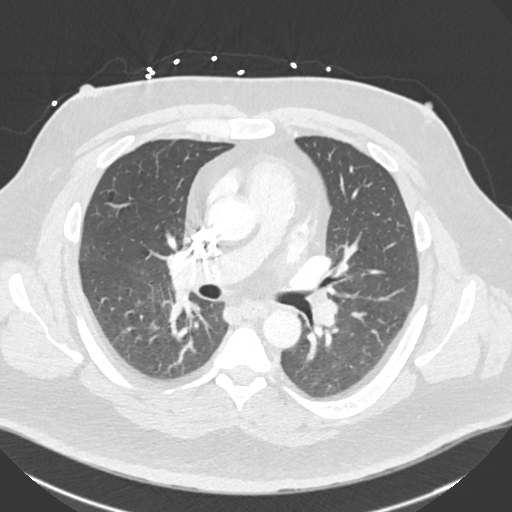
[im 144/192  mediastinal]
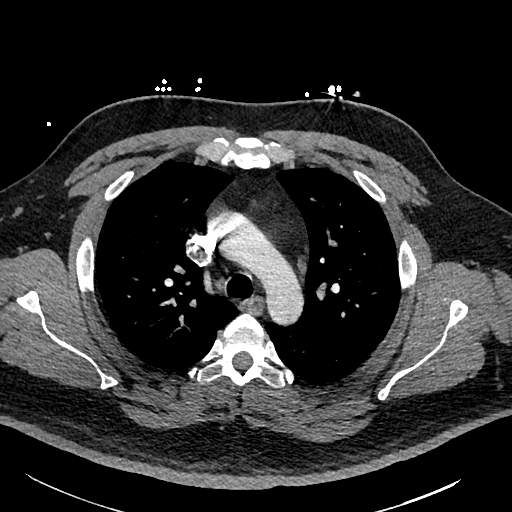
[im 168/192  lung]
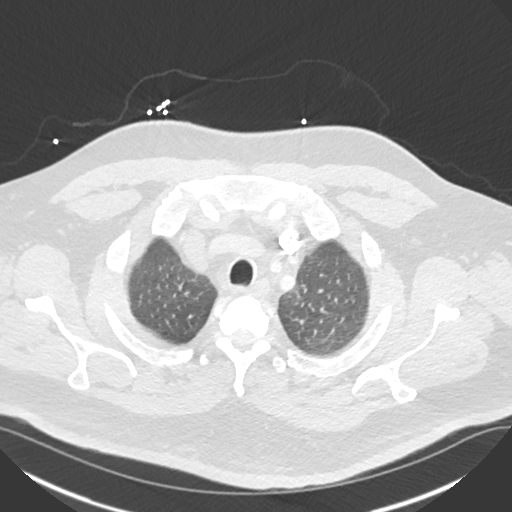

[Series 14: pe 3mm · axial · 0.75mm/px · z∈[+1312,+1384]mm · 2 of 96 slices shown (2 of 2)]
[im 24/96  lung]
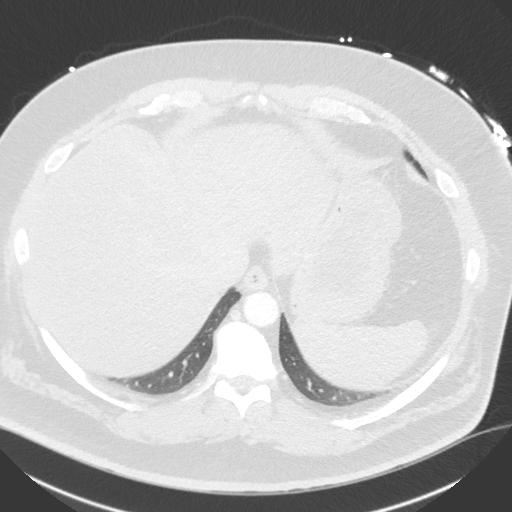
[im 48/96  lung]
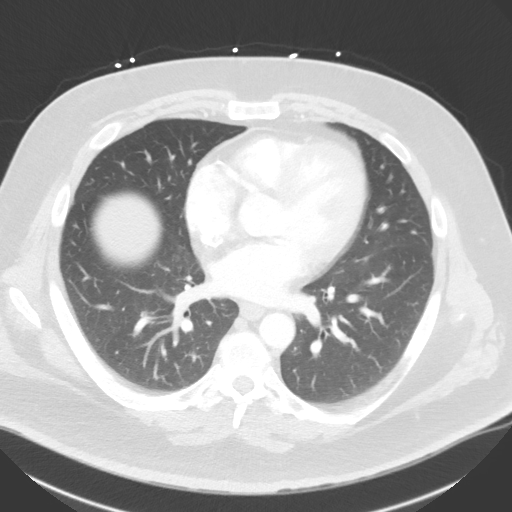

[Series 16: pe thins · axial · 0.75mm/px · z∈[+1276,+1492]mm · 7 of 192 slices shown (2 of 2)]
[im 24/192  lung]
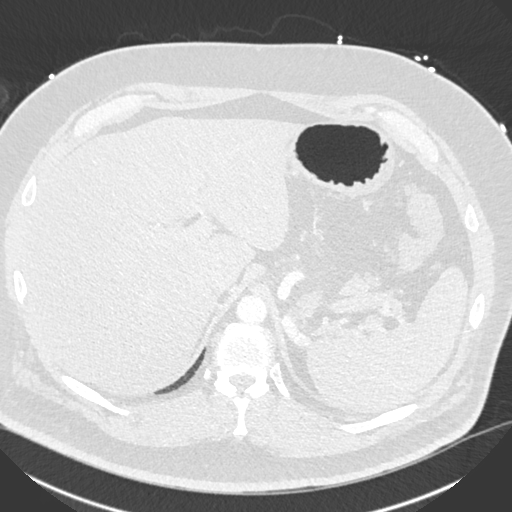
[im 48/192  lung]
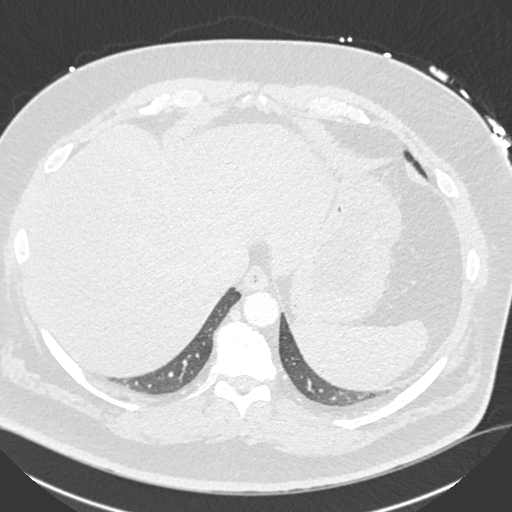
[im 72/192  lung]
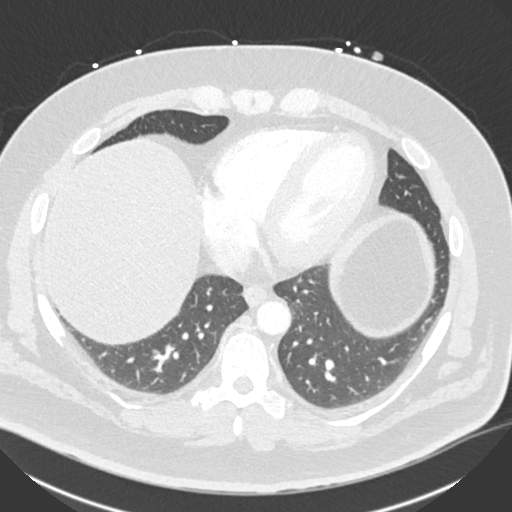
[im 96/192  lung]
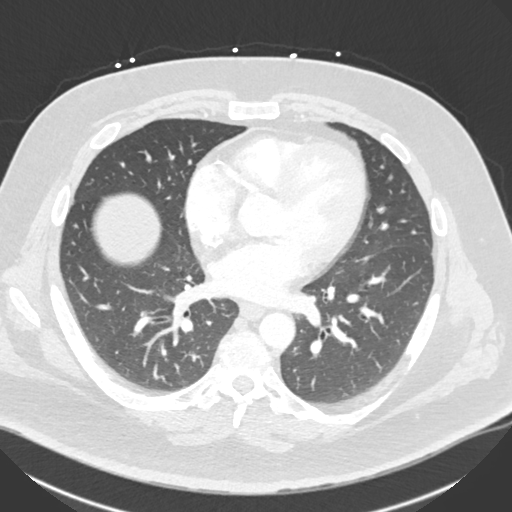
[im 120/192  lung]
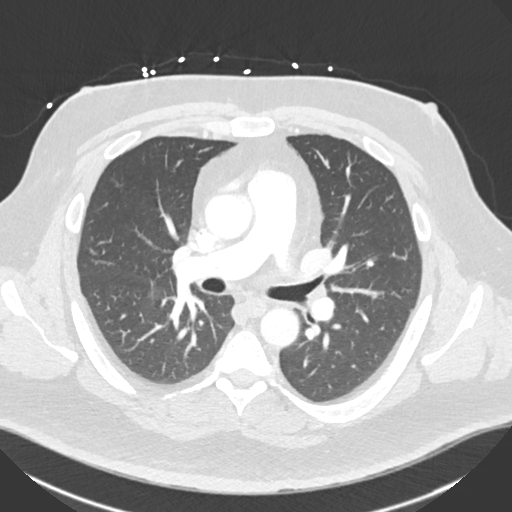
[im 144/192  lung]
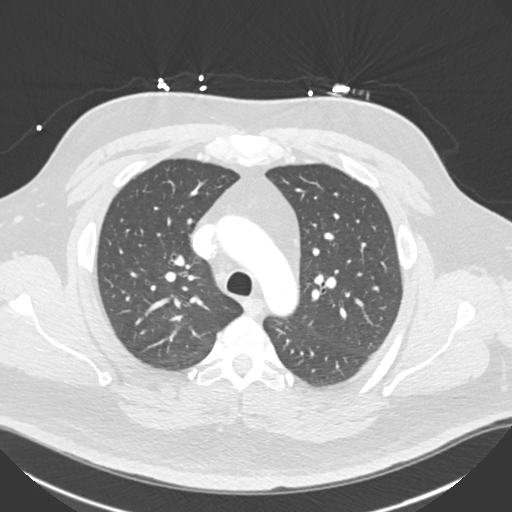
[im 168/192  lung]
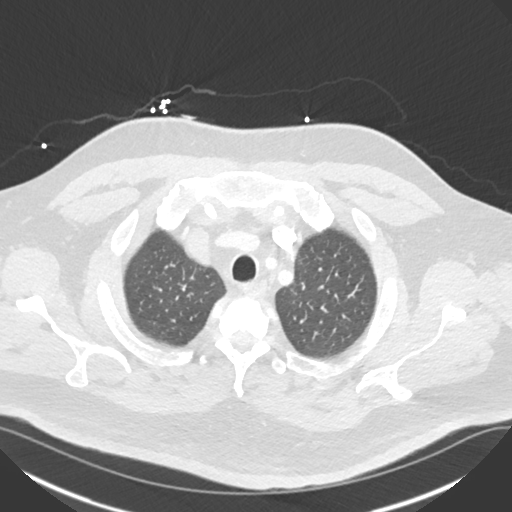

[19 of 38 positions shown; findings below may reference images not displayed]

FINDINGS: Cardiovascular: Satisfactory opacification of the pulmonary arteries
to the segmental level. No evidence of pulmonary embolism. Coronary
artery calcifications. Normal heart size. No pericardial effusion.

Mediastinum/Nodes: No enlarged mediastinal, hilar, or axillary lymph
nodes. Thyroid gland, trachea, and esophagus demonstrate no
significant findings.

Lungs/Pleura: Lungs are clear. No pleural effusion or pneumothorax.
Tiny subpleural lymph node along the major fissure on the right.

Upper Abdomen: Unremarkable.

Musculoskeletal: Thoracic spine degenerative changes.

Review of the MIP images confirms the above findings.
IMPRESSION: 1. No pulmonary emboli or acute abnormality.
2. Calcific coronary artery atherosclerosis.

## 2021-06-03 IMAGING — DX DG CHEST 1V PORT
1 series · 1 of 1 positions shown · non-contrast
Comparison: None.

CLINICAL DATA: Cough in chest pain.

EXAM:
PORTABLE CHEST 1 VIEW

[chest]
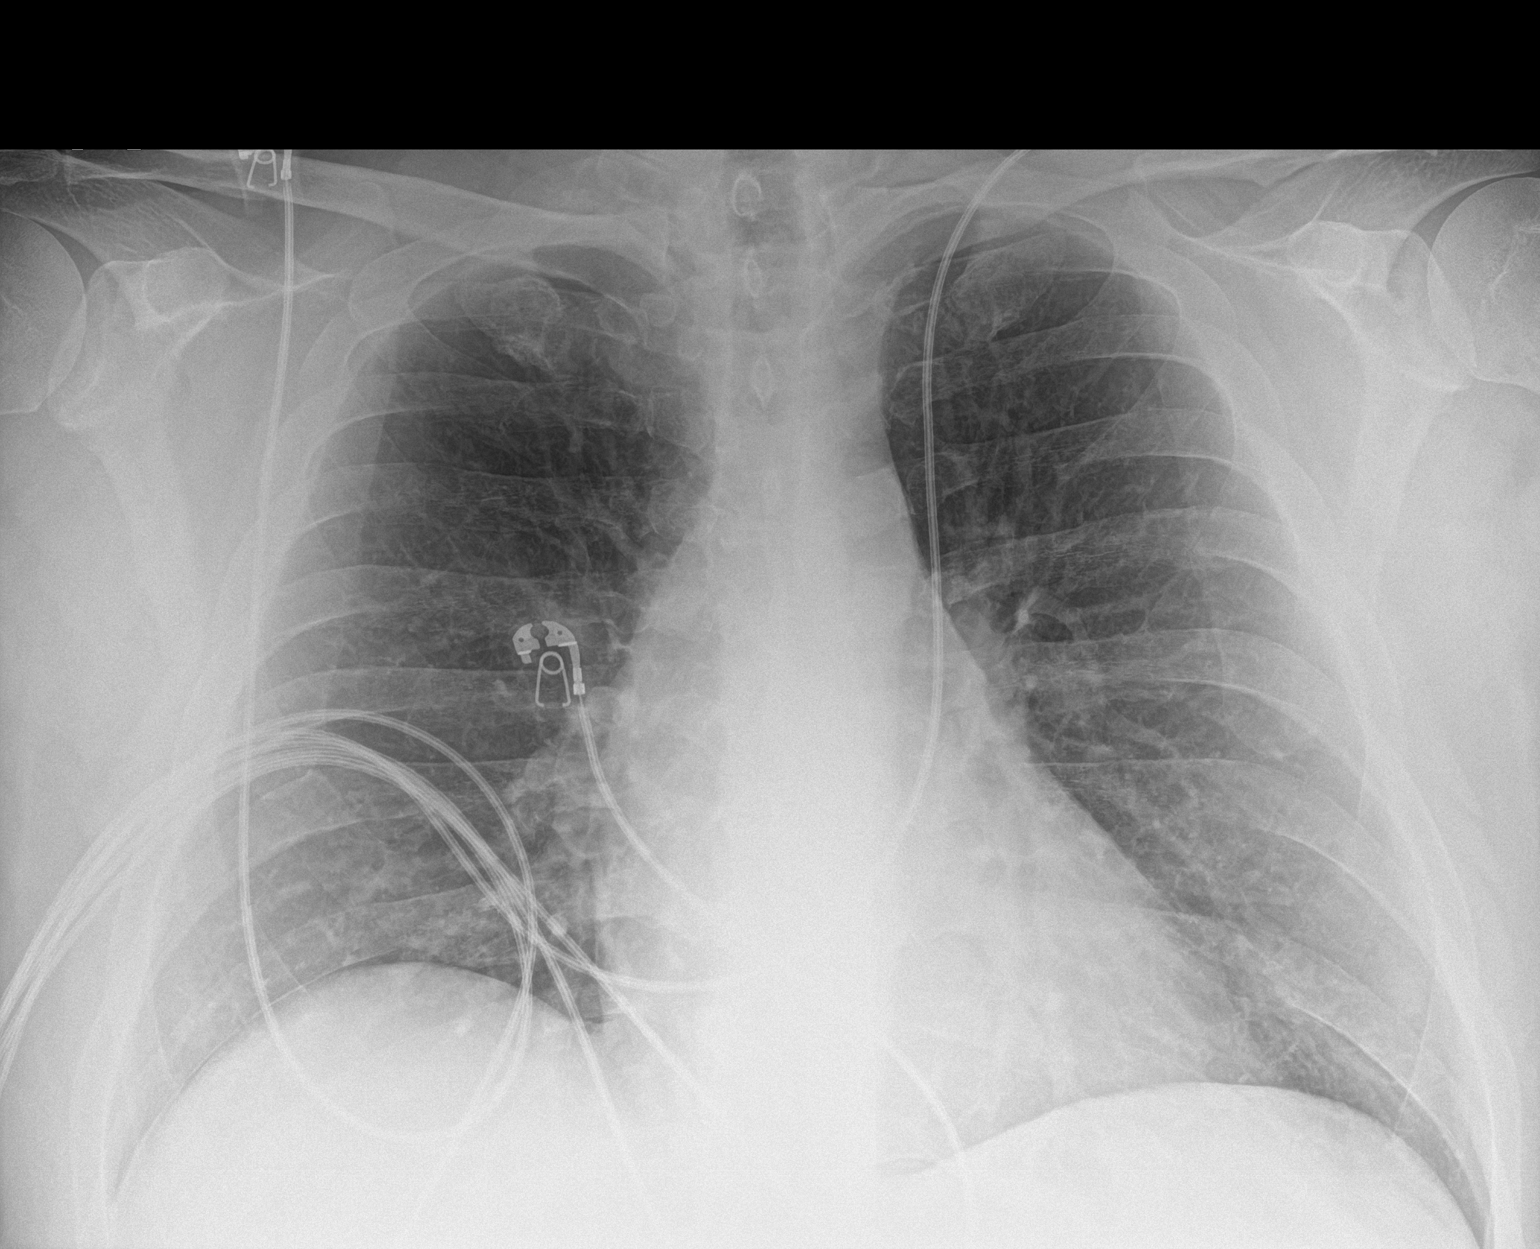

[1 of 1 positions shown; findings below may reference images not displayed]

FINDINGS: Heart size and mediastinal contours are within normal limits. Lungs
are clear. No pleural effusion or pneumothorax is seen. Osseous
structures about the chest are unremarkable.
IMPRESSION: No active disease. No evidence of pneumonia or pulmonary edema.

## 2021-11-01 ENCOUNTER — Emergency Department (HOSPITAL_BASED_OUTPATIENT_CLINIC_OR_DEPARTMENT_OTHER)
Admission: EM | Admit: 2021-11-01 | Discharge: 2021-11-01 | Disposition: A | Discharge status: Home / Self Care | Payer: BC Managed Care – PPO | Attending: Emergency Medicine | Admitting: Emergency Medicine

## 2021-11-01 ENCOUNTER — Encounter (HOSPITAL_BASED_OUTPATIENT_CLINIC_OR_DEPARTMENT_OTHER): Payer: Self-pay | Admitting: Emergency Medicine

## 2021-11-01 ENCOUNTER — Other Ambulatory Visit: Payer: Self-pay

## 2021-11-01 ENCOUNTER — Emergency Department (HOSPITAL_BASED_OUTPATIENT_CLINIC_OR_DEPARTMENT_OTHER): Payer: BC Managed Care – PPO

## 2021-11-01 DIAGNOSIS — E1165 Type 2 diabetes mellitus with hyperglycemia: Secondary | ICD-10-CM | POA: Insufficient documentation

## 2021-11-01 DIAGNOSIS — Z21 Asymptomatic human immunodeficiency virus [HIV] infection status: Secondary | ICD-10-CM | POA: Diagnosis not present

## 2021-11-01 DIAGNOSIS — R1033 Periumbilical pain: Secondary | ICD-10-CM | POA: Insufficient documentation

## 2021-11-01 DIAGNOSIS — I10 Essential (primary) hypertension: Secondary | ICD-10-CM | POA: Insufficient documentation

## 2021-11-01 DIAGNOSIS — Z79899 Other long term (current) drug therapy: Secondary | ICD-10-CM | POA: Insufficient documentation

## 2021-11-01 DIAGNOSIS — R21 Rash and other nonspecific skin eruption: Secondary | ICD-10-CM

## 2021-11-01 DIAGNOSIS — Z7984 Long term (current) use of oral hypoglycemic drugs: Secondary | ICD-10-CM | POA: Insufficient documentation

## 2021-11-01 DIAGNOSIS — R6883 Chills (without fever): Secondary | ICD-10-CM | POA: Insufficient documentation

## 2021-11-01 DIAGNOSIS — R109 Unspecified abdominal pain: Secondary | ICD-10-CM

## 2021-11-01 DIAGNOSIS — R110 Nausea: Secondary | ICD-10-CM | POA: Diagnosis not present

## 2021-11-01 DIAGNOSIS — R197 Diarrhea, unspecified: Secondary | ICD-10-CM | POA: Diagnosis present

## 2021-11-01 DIAGNOSIS — R748 Abnormal levels of other serum enzymes: Secondary | ICD-10-CM | POA: Insufficient documentation

## 2021-11-01 LAB — COMPREHENSIVE METABOLIC PANEL
ALT: 26 U/L (ref 0–44)
AST: 26 U/L (ref 15–41)
Albumin: 4.4 g/dL (ref 3.5–5.0)
Alkaline Phosphatase: 49 U/L (ref 38–126)
Anion gap: 13 (ref 5–15)
BUN: 15 mg/dL (ref 6–20)
CO2: 23 mmol/L (ref 22–32)
Calcium: 10.2 mg/dL (ref 8.9–10.3)
Chloride: 98 mmol/L (ref 98–111)
Creatinine, Ser: 0.98 mg/dL (ref 0.61–1.24)
GFR, Estimated: >60 mL/min (ref >60)
Glucose, Bld: 162 mg/dL — ABNORMAL HIGH (ref 70–99)
Potassium: 3.8 mmol/L (ref 3.5–5.1)
Sodium: 134 mmol/L — ABNORMAL LOW (ref 135–145)
Total Bilirubin: 0.6 mg/dL (ref 0.3–1.2)
Total Protein: 7.9 g/dL (ref 6.5–8.1)

## 2021-11-01 LAB — CBC
HCT: 46.8 % (ref 39.0–52.0)
Hemoglobin: 15.4 g/dL (ref 13.0–17.0)
MCH: 27.2 pg (ref 26.0–34.0)
MCHC: 32.9 g/dL (ref 30.0–36.0)
MCV: 82.7 fL (ref 80.0–100.0)
Platelets: 244 10*3/uL (ref 150–400)
RBC: 5.66 MIL/uL (ref 4.22–5.81)
RDW: 16.9 % — ABNORMAL HIGH (ref 11.5–15.5)
WBC: 7.4 10*3/uL (ref 4.0–10.5)
nRBC: 0 % (ref 0.0–0.2)

## 2021-11-01 LAB — URINALYSIS, ROUTINE W REFLEX MICROSCOPIC
Bilirubin Urine: NEGATIVE
Glucose, UA: >1000 mg/dL — AB
Hgb urine dipstick: NEGATIVE
Ketones, ur: 15 mg/dL — AB
Leukocytes,Ua: NEGATIVE
Nitrite: NEGATIVE
Protein, ur: 30 mg/dL — AB
Specific Gravity, Urine: >1.046 — ABNORMAL HIGH (ref 1.005–1.030)
pH: 6 (ref 5.0–8.0)

## 2021-11-01 LAB — LIPASE, BLOOD: Lipase: 53 U/L — ABNORMAL HIGH (ref 11–51)

## 2021-11-01 MED ORDER — DEXAMETHASONE SODIUM PHOSPHATE 10 MG/ML IJ SOLN
10.0000 mg | Freq: Once | INTRAMUSCULAR | Status: AC
  Administered 2021-11-01: 10 mg via INTRAVENOUS
  Filled 2021-11-01: qty 1

## 2021-11-01 MED ORDER — DICYCLOMINE HCL 20 MG PO TABS: 20.0000 mg | ORAL_TABLET | Freq: Three times a day (TID) | ORAL | 0 refills | Status: AC | PRN

## 2021-11-01 MED ORDER — DIPHENHYDRAMINE HCL 50 MG/ML IJ SOLN
25.0000 mg | Freq: Once | INTRAMUSCULAR | Status: AC
  Administered 2021-11-01: 25 mg via INTRAVENOUS
  Filled 2021-11-01: qty 1

## 2021-11-01 MED ORDER — ONDANSETRON HCL 4 MG/2ML IJ SOLN
4.0000 mg | Freq: Once | INTRAMUSCULAR | Status: AC
  Administered 2021-11-01: 4 mg via INTRAVENOUS
  Filled 2021-11-01: qty 2

## 2021-11-01 MED ORDER — PREDNISONE 20 MG PO TABS: 60.0000 mg | ORAL_TABLET | Freq: Every day | ORAL | 0 refills | Status: AC

## 2021-11-01 MED ORDER — DICYCLOMINE HCL 10 MG/ML IM SOLN
20.0000 mg | Freq: Once | INTRAMUSCULAR | Status: AC
  Administered 2021-11-01: 20 mg via INTRAMUSCULAR
  Filled 2021-11-01: qty 2

## 2021-11-01 MED ORDER — IOHEXOL 300 MG/ML  SOLN
100.0000 mL | Freq: Once | INTRAMUSCULAR | Status: AC | PRN
  Administered 2021-11-01: 100 mL via INTRAVENOUS

## 2021-11-01 MED ORDER — ONDANSETRON HCL 4 MG PO TABS: 4.0000 mg | ORAL_TABLET | Freq: Four times a day (QID) | ORAL | 0 refills | Status: AC

## 2021-11-01 MED ORDER — SODIUM CHLORIDE 0.9 % IV BOLUS
1000.0000 mL | Freq: Once | INTRAVENOUS | Status: AC
  Administered 2021-11-01: 1000 mL via INTRAVENOUS

## 2021-11-01 NOTE — ED Triage Notes (Signed)
Monday you ate tuna and were feeling queasy, and then Tuesday queasy, chills Tuesday night, felt "loopy", Wednesday, n/v/d abdominal pain in pelvic area, constant pressure. Cant keep anything on his stomach. Last night developed a red/raised rash to chest,arms/legs.

## 2021-11-01 NOTE — ED Notes (Addendum)
Pt had hives upon discharge, Provider aware. Provider was fine with the small amount of hives that came back on the Pt's forearms. Instructed to take more Benadryl throughout the day. Discharge paperwork given and understood.

## 2021-11-01 NOTE — ED Provider Notes (Signed)
[facility] EMERGENCY DEPT Provider Note   CSN: [id number] Arrival date & time: 11/01/21  8413     History  Chief Complaint  Patient presents with   Abdominal Pain    Timothy Knox is a 51 y.o. male.  Timothy Knox is a 51 y.o. male with a history of HIV, hypertension, diabetes and syphilis, who presents to the emergency department for evaluation of nausea, diarrhea, abdominal discomfort and rash.  Patient reports Monday after eating tuna salad that he made at home he started to feel a bit queasy.  He reports this continued into Tuesday and he also had some chills but did not have a fever.  He reports on Wednesday in particular he started having episodes of loose watery stools, did not see any blood in the stool and reports a constant pressure and discomfort in the lower abdomen and pelvic region.  He reports that it does not feel like pain but describes it as "excruciating discomfort".  He denies any pain with urination, dysuria or hematuria, no flank pain.  No penile discharge or testicular pain or swelling.  Reports pain has continued he will occasionally get brief periods of relief but discomfort comes back quickly and he is continued to feel nauseated.  Reports that last night he went to bed and woke up with a diffuse red raised rash over his body.  He reports that he had a Bicillin injection on Wednesday for continued treatment of syphilis but otherwise no new medications or foods.  He took a dose of Benadryl and the rash has since significantly improved.  He reports it was itchy and burning.  No associated facial swelling or difficulty breathing.  No new medications or household products that he can recall.  Denies any recent tick exposures or outdoor activities.  The history is provided by the patient and medical records.  Abdominal Pain Associated symptoms: chills, diarrhea and nausea   Associated symptoms: no chest pain, no cough, no dysuria, no fever, no hematuria,  no shortness of breath and no vomiting        Home Medications Prior to Admission medications   Medication Sig Start Date End Date Taking? Authorizing Provider  dicyclomine (BENTYL) 20 MG tablet Take 1 tablet (20 mg total) by mouth 3 (three) times daily as needed (Abdominal pain). 11/01/21  Yes Dartha Lodge, PA-C  ondansetron (ZOFRAN) 4 MG tablet Take 1 tablet (4 mg total) by mouth every 6 (six) hours. 11/01/21  Yes Dartha Lodge, PA-C  predniSONE (DELTASONE) 20 MG tablet Take 3 tablets (60 mg total) by mouth daily for 5 days. 11/01/21 11/06/21 Yes Dartha Lodge, PA-C  ACETAMINOPHEN PO Take 4 tablets by mouth daily as needed (headache).    [provider]  atorvastatin (LIPITOR) 20 MG tablet Take 20 mg by mouth at bedtime.  02/08/17   [provider]  diphenhydrAMINE HCl, Sleep, (ZZZQUIL PO) Take by mouth at bedtime as needed (sleep).    [provider]  elvitegravir-cobicistat-emtricitabine-tenofovir (GENVOYA) 150-150-200-10 MG TABS tablet Take 1 tablet by mouth daily with breakfast.    [provider]  lisinopril (PRINIVIL,ZESTRIL) 10 MG tablet Take 10 mg by mouth daily.  02/05/17   [provider]  metFORMIN (GLUCOPHAGE-XR) 500 MG 24 hr tablet Take 1,000 mg by mouth 2 (two) times daily.    [provider]  Semaglutide (OZEMPIC, 0.25 OR 0.5 MG/DOSE, [place]) Inject 0.5 mg into the skin every Friday.    [provider]  Tadalafil (CIALIS) 2.5 MG TABS Take 2.5 mg by mouth daily.    [provider]      Allergies    Patient has no known allergies.    Review of Systems   Review of Systems  Constitutional:  Positive for chills. Negative for fever.  HENT: Negative.  Negative for facial swelling.   Respiratory:  Negative for cough and shortness of breath.   Cardiovascular:  Negative for chest pain.  Gastrointestinal:  Positive for abdominal pain, diarrhea and nausea. Negative for vomiting.  Genitourinary:  Negative for  dysuria, flank pain, frequency, genital sores and hematuria.  Musculoskeletal:  Negative for arthralgias and myalgias.  Skin:  Negative for color change and rash.  All other systems reviewed and are negative.   Physical Exam Updated Vital Signs BP (!) 141/87 (BP Location: Right Arm)   Pulse 78   Temp 98.4 F (36.9 C) (Oral)   Resp 17   SpO2 99%  Physical Exam Vitals and nursing note reviewed.  Constitutional:      General: He is not in acute distress.    Appearance: Normal appearance. He is well-developed. He is obese. He is not ill-appearing or diaphoretic.  HENT:     Head: Normocephalic and atraumatic.  Eyes:     General:        Right eye: No discharge.        Left eye: No discharge.     Pupils: Pupils are equal, round, and reactive to light.  Cardiovascular:     Rate and Rhythm: Normal rate and regular rhythm.     Pulses: Normal pulses.     Heart sounds: Normal heart sounds.  Pulmonary:     Effort: Pulmonary effort is normal. No respiratory distress.     Breath sounds: Normal breath sounds. No wheezing or rales.     Comments: Respirations equal and unlabored, patient able to speak in full sentences, lungs clear to auscultation bilaterally  Abdominal:     General: Bowel sounds are normal. There is no distension.     Palpations: Abdomen is soft. There is no mass.     Tenderness: There is abdominal tenderness. There is no guarding.     Comments: Abdomen soft, nondistended, bowel sounds present throughout, there is some mild tenderness and discomfort in the periumbilical and suprapubic regions, all other quadrants nontender, no guarding or rebound tenderness.  Genitourinary:    Comments: No testicular tenderness or swelling. Musculoskeletal:        General: No deformity.     Cervical back: Neck supple.  Skin:    General: Skin is warm and dry.     Capillary Refill: Capillary refill takes less than 2 seconds.  Neurological:     Mental Status: He is alert and oriented to  person, place, and time.     Coordination: Coordination normal.     Comments: Speech is clear, able to follow commands Moves extremities without ataxia, coordination intact  Psychiatric:        Mood and Affect: Mood normal.        Behavior: Behavior normal.     ED Results / Procedures / Treatments   Labs (all labs ordered are listed, but only abnormal results are displayed) Labs Reviewed  URINALYSIS, ROUTINE W REFLEX MICROSCOPIC - Abnormal; Notable for the following components:      Result Value   Specific Gravity, Urine >1.046 (*)    Glucose, UA >1,000 (*)    Ketones, ur 15 (*)  Protein, ur 30 (*)    All other components within normal limits  LIPASE, BLOOD - Abnormal; Notable for the following components:   Lipase 53 (*)    All other components within normal limits  COMPREHENSIVE METABOLIC PANEL - Abnormal; Notable for the following components:   Sodium 134 (*)    Glucose, Bld 162 (*)    All other components within normal limits  CBC - Abnormal; Notable for the following components:   RDW 16.9 (*)    All other components within normal limits  RPR    EKG None  Radiology CT ABDOMEN PELVIS W CONTRAST  Result Date: 11/01/2021 CLINICAL DATA:  Nausea, vomiting, diarrhea, and abdominal pain. EXAM: CT ABDOMEN AND PELVIS WITH CONTRAST TECHNIQUE: Multidetector CT imaging of the abdomen and pelvis was performed using the standard protocol following bolus administration of intravenous contrast. RADIATION DOSE REDUCTION: This exam was performed according to the departmental dose-optimization program which includes automated exposure control, adjustment of the mA and/or kV according to patient size and/or use of iterative reconstruction technique. CONTRAST:  OMNIPAQUE IOHEXOL 300 MG/ML  SOLN COMPARISON:  None Available. FINDINGS: Lower chest: No acute abnormality. Hepatobiliary: No focal liver abnormality is seen. No gallstones, gallbladder wall thickening, or biliary dilatation.  Pancreas: Unremarkable. No pancreatic ductal dilatation or surrounding inflammatory changes. Spleen: Normal in size without focal abnormality. Adrenals/Urinary Tract: Adrenal glands are normal. The right kidney is unremarkable.  The right ureters normal. The left kidney is unremarkable.  The left ureter is normal. The bladder is poorly distended. Attenuation of the fluid in the bladder is 46 Hounsfield units. Stomach/Bowel: Stomach and small bowel are normal. There is fluid in the colon extending from the cecum, through the proximal half of the descending colon. No wall thickening. No obstruction. The appendix is normal in appearance. Vascular/Lymphatic: The aorta and branching vessels are normal. No adenopathy. A few shotty nodes are identified in the retroperitoneum. Reproductive: Prostate is unremarkable. Other: No free air or free fluid. Musculoskeletal: No acute or significant osseous findings. IMPRESSION: 1. Fluid in the colon as above consistent with history of diarrhea. No wall thickening, obstruction, or pericolonic stranding. The stomach and small bowel are normal. 2. The fluid in the bladder demonstrates mild increased attenuation measured at 46 Hounsfield units. This finding raises the possibility of UTI or hematuria. Recommend correlation with urinalysis. 3. No other abnormalities. Electronically Signed   By: Gerome Sam III M.D.   On: 11/01/2021 13:37    Procedures Procedures    Medications Ordered in ED Medications  ondansetron (ZOFRAN) injection 4 mg (4 mg Intravenous Given 11/01/21 1310)  sodium chloride 0.9 % bolus 1,000 mL ( Intravenous Stopped 11/01/21 1448)  dicyclomine (BENTYL) injection 20 mg (20 mg Intramuscular Given 11/01/21 1318)  iohexol (OMNIPAQUE) 300 MG/ML solution 100 mL (100 mLs Intravenous Contrast Given 11/01/21 1251)  diphenhydrAMINE (BENADRYL) injection 25 mg (25 mg Intravenous Given 11/01/21 1515)  dexamethasone (DECADRON) injection 10 mg (10 mg Intravenous Given  11/01/21 1513)    ED Course/ Medical Decision Making/ A&P                           Medical Decision Making Amount and/or Complexity of Data Reviewed Labs: ordered.   51 y.o. male presents to the ED with complaints of diarrhea, abdominal discomfort, rash, this involves an extensive number of treatment options, and is a complaint that carries with it a high risk of complications and morbidity.  The  differential diagnosis includes diarrheal illness, colitis, gastroenteritis, foodborne illness, prostatitis, UTI  On arrival pt is nontoxic, vitals significant for mild hypertension but otherwise unremarkable. Exam significant for with some mild suprapubic tenderness, but exam overall reassuring.  Faint red raised areas noted diffusely over the extremities and trunk, significantly improved compared to the picture patient showed me from last night.  Additional history obtained from chart review. Previous records obtained and reviewed   I ordered medication including IV fluids, Zofran and Bentyl for abdominal pain and nausea  Lab Tests:  I Ordered, reviewed, and interpreted labs, which included: No leukocytosis and stable hemoglobin, no significant electrolyte derangements, glucose 162, normal renal and liver function, lipase minimally elevated at 53.  UA without signs of infection.  Did send RPR given patient's recent treatment for trending, this is pending and can be followed by his PCP  Imaging Studies ordered:  I ordered imaging studies which included CT abdomen pelvis, I independently visualized and interpreted imaging which showed fluid in the colon consistent with history of diarrhea but without signs of colitis, fluid in the bladder, but UA without any signs of infection or hematuria.  ED Course:   Patient's abdominal pain has overall improved here in the ED, tolerating p.o. without any vomiting.  Labs and CT overall reassuring.  Suspect potential gastroenteritis, could have been foodborne  potentially.  Encourage patient to continue using Zofran and Bentyl as needed at home and to eat a bland diet over the next few days and follow-up with his PCP if discomfort is not resolving.  Rash did start to return here in the ED as Benadryl wore off, this appears urticarial, no other systems involved to suggest anaphylaxis, unclear trigger for reaction, treated here in the ED with steroid and Benadryl with improvement.  Will be discharged with steroid burst and encouraged to continue to use Benadryl or other antihistamine as well as Pepcid to help control rash and to follow-up with primary care if not resolving.  Return precautions discussed, discharged home in good condition.  At this time there does not appear to be any evidence of an acute emergency medical condition requiring further emergent evaluation and the patient appears stable for discharge with appropriate outpatient follow up. Diagnosis and return precautions discussed with patient who verbalizes understanding and is agreeable to discharge.       Portions of this note were generated with Scientist, clinical (histocompatibility and immunogenetics). Dictation errors may occur despite best attempts at proofreading.         Final Clinical Impression(s) / ED Diagnoses Final diagnoses:  Diarrhea, unspecified type  Abdominal discomfort  Nausea  Rash    Rx / DC Orders ED Discharge Orders          Ordered    ondansetron (ZOFRAN) 4 MG tablet  Every 6 hours        11/01/21 1516    dicyclomine (BENTYL) 20 MG tablet  3 times daily PRN        11/01/21 1516    predniSONE (DELTASONE) 20 MG tablet  Daily        11/01/21 1516              Dartha Lodge, [place] 11/01/21 1615    Alvira Monday, MD 11/01/21 [phone]

## 2021-11-01 NOTE — ED Notes (Signed)
Rash worse to bilateral extremities, Provider aware.

## 2021-11-01 NOTE — Discharge Instructions (Addendum)
Your lab work and CT scan today were overall reassuring.  CT shows signs of diarrheal illness, no other significant abnormalities.  No signs of urinary tract infection.  You can use Zofran as needed for nausea and Bentyl as needed for abdominal discomfort.  I suspect the rash is likely an allergic reaction to something.  Take steroids for the next 5 days starting tomorrow and you can take Benadryl every 6 hours as needed, if this causes drowsiness you can use Zyrtec twice daily instead.  You can also take Pepcid twice daily to help with histamine blocking for allergic reaction.  Follow-up with your primary care doctor if rash is not improving.  Recommend ibuprofen every 2 hours as needed

## 2021-11-01 NOTE — ED Notes (Signed)
Pt ambulated to bathroom 

## 2021-11-02 LAB — RPR
RPR Ser Ql: REACTIVE — AB
RPR Titer: 1:4 {titer}

## 2021-11-03 LAB — T.PALLIDUM AB, TOTAL: T Pallidum Abs: REACTIVE — AB

## 2021-12-18 ENCOUNTER — Other Ambulatory Visit (HOSPITAL_COMMUNITY): Payer: Self-pay

## 2021-12-18 MED ORDER — TADALAFIL 5 MG PO TABS
5.0000 mg | ORAL_TABLET | Freq: Every day | ORAL | 1 refills | Status: AC
  Filled 2021-12-18: qty 60, 60d supply, fill #0
  Filled 2022-02-16: qty 60, 60d supply, fill #1

## 2022-02-16 ENCOUNTER — Other Ambulatory Visit (HOSPITAL_COMMUNITY): Payer: Self-pay

## 2023-09-24 ENCOUNTER — Emergency Department (HOSPITAL_COMMUNITY)
Admission: EM | Admit: 2023-09-24 | Discharge: 2023-09-24 | Disposition: A | Discharge status: Home / Self Care | Payer: Worker's Compensation | Attending: Emergency Medicine | Admitting: Emergency Medicine

## 2023-09-24 ENCOUNTER — Emergency Department (HOSPITAL_COMMUNITY): Payer: Self-pay

## 2023-09-24 ENCOUNTER — Other Ambulatory Visit: Payer: Self-pay

## 2023-09-24 ENCOUNTER — Encounter (HOSPITAL_COMMUNITY): Payer: Self-pay

## 2023-09-24 DIAGNOSIS — S52102A Unspecified fracture of upper end of left radius, initial encounter for closed fracture: Secondary | ICD-10-CM | POA: Insufficient documentation

## 2023-09-24 DIAGNOSIS — S0001XA Abrasion of scalp, initial encounter: Secondary | ICD-10-CM | POA: Diagnosis not present

## 2023-09-24 DIAGNOSIS — W1830XA Fall on same level, unspecified, initial encounter: Secondary | ICD-10-CM | POA: Diagnosis not present

## 2023-09-24 DIAGNOSIS — W19XXXA Unspecified fall, initial encounter: Secondary | ICD-10-CM

## 2023-09-24 DIAGNOSIS — R0781 Pleurodynia: Secondary | ICD-10-CM | POA: Insufficient documentation

## 2023-09-24 DIAGNOSIS — M25522 Pain in left elbow: Secondary | ICD-10-CM | POA: Diagnosis present

## 2023-09-24 DIAGNOSIS — S40812A Abrasion of left upper arm, initial encounter: Secondary | ICD-10-CM | POA: Diagnosis not present

## 2023-09-24 DIAGNOSIS — Z23 Encounter for immunization: Secondary | ICD-10-CM | POA: Diagnosis not present

## 2023-09-24 MED ORDER — HYDROCODONE-ACETAMINOPHEN 5-325 MG PO TABS
1.0000 | ORAL_TABLET | Freq: Once | ORAL | Status: AC
  Administered 2023-09-24: 1 via ORAL
  Filled 2023-09-24: qty 1

## 2023-09-24 MED ORDER — TETANUS-DIPHTH-ACELL PERTUSSIS 5-2.5-18.5 LF-MCG/0.5 IM SUSY
0.5000 mL | PREFILLED_SYRINGE | Freq: Once | INTRAMUSCULAR | Status: AC
  Administered 2023-09-24: 0.5 mL via INTRAMUSCULAR
  Filled 2023-09-24: qty 0.5

## 2023-09-24 MED ORDER — HYDROCODONE-ACETAMINOPHEN 5-325 MG PO TABS: 1.0000 | ORAL_TABLET | Freq: Four times a day (QID) | ORAL | 0 refills | Status: AC | PRN

## 2023-09-24 NOTE — ED Triage Notes (Addendum)
 Patient fell onto his left arm on concrete after losing his balance. Pain is at elbow and down. Patient did hit his head. No LOC. Denies blood thinners.

## 2023-09-24 NOTE — ED Notes (Signed)
 [employer] called for Splint.

## 2023-09-24 NOTE — ED Notes (Signed)
 Patient resting in bed at this time.

## 2023-09-24 NOTE — Progress Notes (Signed)
 Orthopedic Tech Progress Note Patient Details:  Timothy Knox 1970-05-06 [id number]  Ortho Devices Type of Ortho Device: Arm sling, Long arm splint Ortho Device/Splint Location: LUE Ortho Device/Splint Interventions: Application   Post Interventions Patient Tolerated: Well  Mearl Spice Strother Everitt 09/24/2023, 9:39 PM

## 2023-09-24 NOTE — ED Provider Notes (Signed)
 [place] EMERGENCY DEPARTMENT AT [facility] Provider Note   CSN: [id number] Arrival date & time: 09/24/23  1453     History  Chief Complaint  Patient presents with   Arm Injury    Timothy Knox is a 53 y.o. male.  53 year old male with prior medical history as detailed below presents for evaluation.  Patient reports that he fell earlier this afternoon.  He struck his head.  He hit his elbow against the asphalt.  He also has left-sided rib pain.  He denies LOC.  He reports that after the head injury he has had some mild headache and some mild nausea.  He complains of feeling "a little out of it".  He reports that his left elbow is painful with attempted motion. He reports pain to the left ribs with deep inhalation.  He is unsure of his last tetanus.  The history is provided by the patient.       Home Medications Prior to Admission medications   Medication Sig Start Date End Date Taking? Authorizing Provider  ACETAMINOPHEN  PO Take 4 tablets by mouth daily as needed (headache).    [provider]  atorvastatin  (LIPITOR) 20 MG tablet Take 20 mg by mouth at bedtime.  02/08/17   [provider]  dicyclomine  (BENTYL ) 20 MG tablet Take 1 tablet (20 mg total) by mouth 3 (three) times daily as needed (Abdominal pain). 11/01/21   Kehrli, Kelsey F, PA-C  diphenhydrAMINE  HCl, Sleep, (ZZZQUIL PO) Take by mouth at bedtime as needed (sleep).    [provider]  elvitegravir-cobicistat-emtricitabine-tenofovir (GENVOYA ) 150-150-200-10 MG TABS tablet Take 1 tablet by mouth daily with breakfast.    [provider]  lisinopril  (PRINIVIL ,ZESTRIL ) 10 MG tablet Take 10 mg by mouth daily.  02/05/17   [provider]  metFORMIN  (GLUCOPHAGE -XR) 500 MG 24 hr tablet Take 1,000 mg by mouth 2 (two) times daily.    [provider]  ondansetron  (ZOFRAN ) 4 MG tablet Take 1 tablet (4 mg total) by mouth every 6 (six) hours. 11/01/21   Kehrli, Kelsey F,  PA-C  Semaglutide (OZEMPIC, 0.25 OR 0.5 MG/DOSE, [place]) Inject 0.5 mg into the skin every Friday.    [provider]  Tadalafil  (CIALIS ) 2.5 MG TABS Take 2.5 mg by mouth daily.    [provider]  tadalafil  (CIALIS ) 5 MG tablet Take 1 tablet by mouth once daily. 12/18/21         Allergies    Patient has no known allergies.    Review of Systems   Review of Systems  All other systems reviewed and are negative.   Physical Exam Updated Vital Signs BP (!) 140/80 (BP Location: Right Arm)   Pulse 92   Temp 98.8 F (37.1 C) (Oral)   Resp 18   Ht 6' (1.829 m)   Wt 123.4 kg   SpO2 97%   BMI 36.89 kg/m  Physical Exam Vitals and nursing note reviewed.  Constitutional:      General: He is not in acute distress.    Appearance: Normal appearance. He is well-developed.  HENT:     Head: Normocephalic.     Comments: Abrasion to the left scalp. Eyes:     Conjunctiva/sclera: Conjunctivae normal.     Pupils: Pupils are equal, round, and reactive to light.  Cardiovascular:     Rate and Rhythm: Normal rate and regular rhythm.     Heart sounds: Normal heart sounds.  Pulmonary:     Effort: Pulmonary  effort is normal. No respiratory distress.     Breath sounds: Normal breath sounds.     Comments: Diffuse tenderness to the left lateral ribs.  No step-off.  No crepitus.  No abrasions to the chest wall. Abdominal:     General: There is no distension.     Palpations: Abdomen is soft.     Tenderness: There is no abdominal tenderness.  Musculoskeletal:        General: Tenderness present. No deformity.     Cervical back: Normal range of motion and neck supple.     Comments: Diffuse tenderness to the lateral aspect of the left elbow.  Limited active range of motion of the left elbow.  Distal left upper extremity is neurovascular intact.  Skin:    General: Skin is warm and dry.     Comments: Multiple abrasions to the left upper arm and left lateral elbow.  Neurological:      General: No focal deficit present.     Mental Status: He is alert and oriented to person, place, and time.     ED Results / Procedures / Treatments   Labs (all labs ordered are listed, but only abnormal results are displayed) Labs Reviewed - No data to display  EKG None  Radiology DG Elbow Complete Left Result Date: 09/24/2023 CLINICAL DATA:  Arm injury. Fall onto left arm on concrete after losing his balance while riding an inflatable rooster. EXAM: LEFT ELBOW - COMPLETE 3+ VIEW COMPARISON:  None Available. FINDINGS: Nondisplaced fracture of the radial neck. There is no elbow joint effusion, however intra-articular component is not confidently visualized. The alignment is maintained, no dislocation. IMPRESSION: Nondisplaced radial neck fracture. Electronically Signed   By: Chadwick Colonel M.D.   On: 09/24/2023 16:03    Procedures Procedures    Medications Ordered in ED Medications  Tdap (BOOSTRIX) injection 0.5 mL (0.5 mLs Intramuscular Given 09/24/23 2113)  HYDROcodone -acetaminophen  (NORCO/VICODIN) 5-325 MG per tablet 1 tablet (1 tablet Oral Given 09/24/23 2112)    ED Course/ Medical Decision Making/ A&P                                 Medical Decision Making Amount and/or Complexity of Data Reviewed Radiology: ordered.  Risk Prescription drug management.    Medical Screen Complete  This patient presented to the ED with complaint of fall.  This complaint involves an extensive number of treatment options. The initial differential diagnosis includes, but is not limited to, trauma  This presentation is: Acute, Self-Limited, Previously Undiagnosed, Uncertain Prognosis, Complicated, Systemic Symptoms, and Threat to Life/Bodily Function  Patient reports fall earlier today.  He struck his head on asphalt.  He injured his left elbow in the fall.  He complains of left-sided rib pain.  Obtained imaging is reassuring without significant abnormality other than nondisplaced  proximal left radial head fracture.  Patient feels improved after treatment.  Sling and splint applied to the left arm.  Patient understands need for close outpatient follow-up with orthopedics.  Strict return precautions given and understood.  Additional history obtained:  External records from outside sources obtained and reviewed including prior ED visits and prior Inpatient records.    Problem List / ED Course:  Left radius head fracture   Reevaluation:  After the interventions noted above, I reevaluated the patient and found that they have: improved   Disposition:  After consideration of the diagnostic results and the patients response  to treatment, I feel that the patent would benefit from close outpatient follow-up.          Final Clinical Impression(s) / ED Diagnoses Final diagnoses:  Fall, initial encounter  Closed fracture of proximal end of left radius, unspecified fracture morphology, initial encounter    Rx / DC Orders ED Discharge Orders     None         Burnette Carte, MD 09/24/23 2222

## 2023-09-24 NOTE — Discharge Instructions (Signed)
 Return for any problem.  ?
